# Patient Record
Sex: Female | Born: 1950 | Race: White | Hispanic: No | Marital: Married | Smoking: Former smoker
Health system: Southern US, Community
[De-identification: ages and names within clinical notes are randomized; demographics above are authoritative.]

## PROBLEM LIST (undated history)

## (undated) DIAGNOSIS — E039 Hypothyroidism, unspecified: Secondary | ICD-10-CM

## (undated) DIAGNOSIS — I10 Essential (primary) hypertension: Secondary | ICD-10-CM

## (undated) DIAGNOSIS — D539 Nutritional anemia, unspecified: Secondary | ICD-10-CM

## (undated) HISTORY — PX: BREAST BIOPSY: SHX20

## (undated) HISTORY — PX: THYROIDECTOMY: SHX17

## (undated) HISTORY — PX: TUBAL LIGATION: SHX77

## (undated) HISTORY — DX: Nutritional anemia, unspecified: D53.9

---

## 2004-06-08 ENCOUNTER — Ambulatory Visit: Payer: Self-pay | Admitting: Unknown Physician Specialty

## 2004-06-18 ENCOUNTER — Ambulatory Visit: Payer: Self-pay | Admitting: Otolaryngology

## 2004-07-26 ENCOUNTER — Ambulatory Visit: Payer: Self-pay | Admitting: Otolaryngology

## 2008-05-28 ENCOUNTER — Ambulatory Visit: Payer: Self-pay

## 2009-08-12 ENCOUNTER — Ambulatory Visit: Payer: Self-pay | Admitting: Family Medicine

## 2010-12-21 ENCOUNTER — Ambulatory Visit: Payer: Self-pay | Admitting: Family Medicine

## 2012-03-07 ENCOUNTER — Ambulatory Visit: Payer: Self-pay | Admitting: Family Medicine

## 2012-09-05 ENCOUNTER — Ambulatory Visit: Payer: Self-pay | Admitting: Family Medicine

## 2013-04-29 ENCOUNTER — Ambulatory Visit: Payer: Self-pay | Admitting: Family Medicine

## 2014-02-12 ENCOUNTER — Ambulatory Visit: Payer: Self-pay | Admitting: Nurse Practitioner

## 2014-05-01 ENCOUNTER — Ambulatory Visit
Admit: 2014-05-01 | Disposition: A | Discharge status: Home / Self Care | Payer: Self-pay | Attending: Nurse Practitioner | Admitting: Nurse Practitioner

## 2016-05-06 ENCOUNTER — Other Ambulatory Visit: Payer: Self-pay | Admitting: Nurse Practitioner

## 2016-05-06 DIAGNOSIS — Z13820 Encounter for screening for osteoporosis: Secondary | ICD-10-CM

## 2016-05-06 DIAGNOSIS — Z1231 Encounter for screening mammogram for malignant neoplasm of breast: Secondary | ICD-10-CM

## 2016-06-23 ENCOUNTER — Encounter: Payer: Self-pay | Admitting: Radiology

## 2016-06-23 ENCOUNTER — Ambulatory Visit
Admission: RE | Admit: 2016-06-23 | Discharge: 2016-06-23 | Disposition: A | Discharge status: Home / Self Care | Payer: Medicare HMO | Source: Ambulatory Visit | Attending: Nurse Practitioner | Admitting: Nurse Practitioner

## 2016-06-23 DIAGNOSIS — M85851 Other specified disorders of bone density and structure, right thigh: Secondary | ICD-10-CM | POA: Insufficient documentation

## 2016-06-23 DIAGNOSIS — Z13820 Encounter for screening for osteoporosis: Secondary | ICD-10-CM

## 2016-06-23 DIAGNOSIS — Z1231 Encounter for screening mammogram for malignant neoplasm of breast: Secondary | ICD-10-CM

## 2016-06-23 DIAGNOSIS — R928 Other abnormal and inconclusive findings on diagnostic imaging of breast: Secondary | ICD-10-CM | POA: Diagnosis not present

## 2016-06-23 DIAGNOSIS — Z780 Asymptomatic menopausal state: Secondary | ICD-10-CM | POA: Insufficient documentation

## 2016-06-23 DIAGNOSIS — E0590 Thyrotoxicosis, unspecified without thyrotoxic crisis or storm: Secondary | ICD-10-CM | POA: Diagnosis not present

## 2016-06-27 ENCOUNTER — Other Ambulatory Visit: Payer: Self-pay | Admitting: Nurse Practitioner

## 2016-06-27 DIAGNOSIS — R928 Other abnormal and inconclusive findings on diagnostic imaging of breast: Secondary | ICD-10-CM

## 2016-06-27 DIAGNOSIS — N6489 Other specified disorders of breast: Secondary | ICD-10-CM

## 2016-07-14 ENCOUNTER — Ambulatory Visit
Admission: RE | Admit: 2016-07-14 | Discharge: 2016-07-14 | Disposition: A | Discharge status: Home / Self Care | Payer: Medicare HMO | Source: Ambulatory Visit | Attending: Nurse Practitioner | Admitting: Nurse Practitioner

## 2016-07-14 DIAGNOSIS — R928 Other abnormal and inconclusive findings on diagnostic imaging of breast: Secondary | ICD-10-CM | POA: Diagnosis present

## 2016-07-14 DIAGNOSIS — N6489 Other specified disorders of breast: Secondary | ICD-10-CM

## 2018-02-05 ENCOUNTER — Other Ambulatory Visit: Payer: Self-pay | Admitting: Nurse Practitioner

## 2018-02-06 ENCOUNTER — Other Ambulatory Visit: Payer: Self-pay | Admitting: Nurse Practitioner

## 2018-02-06 DIAGNOSIS — Z1231 Encounter for screening mammogram for malignant neoplasm of breast: Secondary | ICD-10-CM

## 2018-02-16 ENCOUNTER — Other Ambulatory Visit: Payer: Self-pay | Admitting: Nurse Practitioner

## 2018-02-16 DIAGNOSIS — Z13820 Encounter for screening for osteoporosis: Secondary | ICD-10-CM

## 2018-06-06 ENCOUNTER — Encounter: Payer: Self-pay | Admitting: Family Medicine

## 2018-06-12 ENCOUNTER — Encounter: Payer: Self-pay | Admitting: *Deleted

## 2018-06-19 ENCOUNTER — Ambulatory Visit (INDEPENDENT_AMBULATORY_CARE_PROVIDER_SITE_OTHER): Payer: Medicare HMO | Admitting: Gastroenterology

## 2018-06-19 ENCOUNTER — Other Ambulatory Visit: Payer: Self-pay

## 2018-06-19 ENCOUNTER — Encounter: Payer: Self-pay | Admitting: Gastroenterology

## 2018-06-19 VITALS — BP 123/79 | HR 101 | Temp 98.9°F | Ht 63.0 in | Wt 168.4 lb

## 2018-06-19 DIAGNOSIS — D510 Vitamin B12 deficiency anemia due to intrinsic factor deficiency: Secondary | ICD-10-CM | POA: Diagnosis not present

## 2018-06-19 DIAGNOSIS — Z1211 Encounter for screening for malignant neoplasm of colon: Secondary | ICD-10-CM | POA: Diagnosis not present

## 2018-06-19 NOTE — Progress Notes (Signed)
April Middleton [address]  [place]  [place], [place] [address]  Main: [phone]  Fax: [phone]   Gastroenterology Consultation  Referring Provider:     Marguerita Merles, MD Primary Care Physician:  Marguerita Merles, MD Reason for Consultation:     Anemia        HPI:   Chief complaint: Anemia  April Middleton is a 68 y.o. y/o female referred for consultation & management  by Dr. Lennox Grumbles, Connye Burkitt, MD.  Patient recently found to have a hemoglobin of 7.2 by primary care provider, with macrocytic anemia and work-up showing elevated intrinsic factor antibody of 64.  Patient has been started on B12 replacement by primary care provider.  No prior history of colonoscopy.  No family history of colon cancer.  The patient denies abdominal or flank pain, anorexia, nausea or vomiting, dysphagia, change in bowel habits or black or bloody stools or weight loss.    Past medical history: Hyperlipidemia hypertension Past Surgical History:  Procedure Laterality Date  . BREAST BIOPSY Left     Prior to Admission medications   Not on File   Family history: Hypertension  Social History   Tobacco Use  . Smoking status: Not on file  Substance Use Topics  . Alcohol use: Not on file  . Drug use: Not on file    Allergies as of 06/19/2018  . (Not on File)    Review of Systems:    All systems reviewed and negative except where noted in HPI.   Physical Exam:   Vitals:   06/19/18 1627  BP: 123/79  Pulse: (!) 101  Temp: 98.9 F (37.2 C)  TempSrc: Oral  Weight: 168 lb 6.4 oz (76.4 kg)  Height: 5\' 3"  (1.6 m)   No LMP recorded. Patient is postmenopausal. Psych:  Alert and cooperative. Normal mood and affect. General:   Alert,  Well-developed, well-nourished, pleasant and cooperative in NAD Head:  Normocephalic and atraumatic. Eyes:  Sclera clear, no icterus.   Conjunctiva pink. Ears:  Normal auditory acuity. Nose:  No deformity, discharge, or lesions. Mouth:  No  deformity or lesions,oropharynx pink & moist. Neck:  Supple; no masses or thyromegaly. Abdomen:  Normal bowel sounds.  No bruits.  Soft, non-tender and non-distended without masses, hepatosplenomegaly or hernias noted.  No guarding or rebound tenderness.    Msk:  Symmetrical without gross deformities. Good, equal movement & strength bilaterally. Pulses:  Normal pulses noted. Extremities:  No clubbing or edema.  No cyanosis. Neurologic:  Alert and oriented x3;  grossly normal neurologically. Skin:  Intact without significant lesions or rashes. No jaundice. Lymph Nodes:  No significant cervical adenopathy. Psych:  Alert and cooperative. Normal mood and affect.   Labs: Scanned labs reviewed, hemoglobin 7.2, elevated MCV at 103, intrinsic factor 64  Imaging Studies: No results found.  Assessment and Plan:   April Middleton is a 68 y.o. y/o female has been referred for anemia  Patient has never had a colonoscopy, and given significant anemia, EGD and colonoscopy recommended to rule out underlying lesions and evaluate for any underlying malignancy  Patient is also never had a screening colonoscopy and colonoscopy can be both for screening, evaluating for polyps, and due to her anemia  We will refer to hematology due to elevated intrinsic factor antibodies Continue B12 replacement as per primary care provider until patient sees hematology  I have discussed alternative options, risks & benefits,  which include, but are not limited to,  bleeding, infection, perforation,respiratory complication & drug reaction.  The patient agrees with this plan & written consent will be obtained.     Dr April Middleton  Speech recognition software was used to dictate the above note.

## 2018-06-25 ENCOUNTER — Other Ambulatory Visit
Admission: RE | Admit: 2018-06-25 | Discharge: 2018-06-25 | Disposition: A | Discharge status: Home / Self Care | Payer: Medicare HMO | Source: Ambulatory Visit | Attending: Gastroenterology | Admitting: Gastroenterology

## 2018-06-25 ENCOUNTER — Other Ambulatory Visit: Payer: Self-pay

## 2018-06-25 DIAGNOSIS — Z1159 Encounter for screening for other viral diseases: Secondary | ICD-10-CM | POA: Diagnosis present

## 2018-06-26 LAB — NOVEL CORONAVIRUS, NAA (HOSP ORDER, SEND-OUT TO REF LAB; TAT 18-24 HRS): SARS-CoV-2, NAA: NOT DETECTED

## 2018-06-27 ENCOUNTER — Encounter: Payer: Self-pay | Admitting: *Deleted

## 2018-06-28 ENCOUNTER — Ambulatory Visit: Payer: Medicare HMO | Admitting: Certified Registered Nurse Anesthetist

## 2018-06-28 ENCOUNTER — Encounter: Payer: Self-pay | Admitting: *Deleted

## 2018-06-28 ENCOUNTER — Encounter
Admission: RE | Disposition: A | Discharge status: Home / Self Care | Payer: Self-pay | Source: Home / Self Care | Attending: Gastroenterology

## 2018-06-28 ENCOUNTER — Ambulatory Visit
Admission: RE | Admit: 2018-06-28 | Discharge: 2018-06-28 | Disposition: A | Discharge status: Home / Self Care | Payer: Medicare HMO | Attending: Gastroenterology | Admitting: Gastroenterology

## 2018-06-28 ENCOUNTER — Other Ambulatory Visit: Payer: Self-pay

## 2018-06-28 DIAGNOSIS — D127 Benign neoplasm of rectosigmoid junction: Secondary | ICD-10-CM | POA: Diagnosis not present

## 2018-06-28 DIAGNOSIS — Z87891 Personal history of nicotine dependence: Secondary | ICD-10-CM | POA: Diagnosis not present

## 2018-06-28 DIAGNOSIS — K2940 Chronic atrophic gastritis without bleeding: Secondary | ICD-10-CM | POA: Insufficient documentation

## 2018-06-28 DIAGNOSIS — I10 Essential (primary) hypertension: Secondary | ICD-10-CM | POA: Diagnosis not present

## 2018-06-28 DIAGNOSIS — D122 Benign neoplasm of ascending colon: Secondary | ICD-10-CM | POA: Insufficient documentation

## 2018-06-28 DIAGNOSIS — Z79890 Hormone replacement therapy (postmenopausal): Secondary | ICD-10-CM | POA: Insufficient documentation

## 2018-06-28 DIAGNOSIS — E039 Hypothyroidism, unspecified: Secondary | ICD-10-CM | POA: Diagnosis not present

## 2018-06-28 DIAGNOSIS — K5730 Diverticulosis of large intestine without perforation or abscess without bleeding: Secondary | ICD-10-CM | POA: Diagnosis not present

## 2018-06-28 DIAGNOSIS — K2980 Duodenitis without bleeding: Secondary | ICD-10-CM | POA: Diagnosis not present

## 2018-06-28 DIAGNOSIS — K621 Rectal polyp: Secondary | ICD-10-CM | POA: Diagnosis not present

## 2018-06-28 DIAGNOSIS — K317 Polyp of stomach and duodenum: Secondary | ICD-10-CM

## 2018-06-28 DIAGNOSIS — K6389 Other specified diseases of intestine: Secondary | ICD-10-CM

## 2018-06-28 DIAGNOSIS — Z79899 Other long term (current) drug therapy: Secondary | ICD-10-CM | POA: Insufficient documentation

## 2018-06-28 DIAGNOSIS — K3189 Other diseases of stomach and duodenum: Secondary | ICD-10-CM | POA: Diagnosis not present

## 2018-06-28 DIAGNOSIS — K635 Polyp of colon: Secondary | ICD-10-CM

## 2018-06-28 DIAGNOSIS — Z1211 Encounter for screening for malignant neoplasm of colon: Secondary | ICD-10-CM

## 2018-06-28 DIAGNOSIS — D510 Vitamin B12 deficiency anemia due to intrinsic factor deficiency: Secondary | ICD-10-CM

## 2018-06-28 HISTORY — DX: Hypothyroidism, unspecified: E03.9

## 2018-06-28 HISTORY — PX: ESOPHAGOGASTRODUODENOSCOPY (EGD) WITH PROPOFOL: SHX5813

## 2018-06-28 HISTORY — DX: Essential (primary) hypertension: I10

## 2018-06-28 HISTORY — PX: COLONOSCOPY WITH PROPOFOL: SHX5780

## 2018-06-28 LAB — KOH PREP: KOH Prep: NONE SEEN

## 2018-06-28 SURGERY — COLONOSCOPY WITH PROPOFOL
Anesthesia: General

## 2018-06-28 MED ORDER — SODIUM CHLORIDE 0.9 % IV SOLN
INTRAVENOUS | Status: DC
  Administered 2018-06-28: 11:00:00 via INTRAVENOUS

## 2018-06-28 MED ORDER — PROPOFOL 500 MG/50ML IV EMUL
INTRAVENOUS | Status: AC
  Filled 2018-06-28: qty 50

## 2018-06-28 MED ORDER — PROPOFOL 500 MG/50ML IV EMUL
INTRAVENOUS | Status: DC | PRN
  Administered 2018-06-28: 120 ug/kg/min via INTRAVENOUS

## 2018-06-28 MED ORDER — GLYCOPYRROLATE 0.2 MG/ML IJ SOLN
INTRAMUSCULAR | Status: DC | PRN
  Administered 2018-06-28: 0.2 mg via INTRAVENOUS

## 2018-06-28 MED ORDER — MIDAZOLAM HCL 5 MG/5ML IJ SOLN
INTRAMUSCULAR | Status: DC | PRN
  Administered 2018-06-28: 2 mg via INTRAVENOUS

## 2018-06-28 MED ORDER — LIDOCAINE HCL (PF) 2 % IJ SOLN
INTRAMUSCULAR | Status: AC
  Filled 2018-06-28: qty 10

## 2018-06-28 MED ORDER — MIDAZOLAM HCL 2 MG/2ML IJ SOLN
INTRAMUSCULAR | Status: AC
  Filled 2018-06-28: qty 2

## 2018-06-28 MED ORDER — PROPOFOL 10 MG/ML IV BOLUS
INTRAVENOUS | Status: DC | PRN
  Administered 2018-06-28: 30 mg via INTRAVENOUS
  Administered 2018-06-28: 70 mg via INTRAVENOUS

## 2018-06-28 MED ORDER — GLYCOPYRROLATE 0.2 MG/ML IJ SOLN
INTRAMUSCULAR | Status: AC
  Filled 2018-06-28: qty 1

## 2018-06-28 NOTE — Anesthesia Preprocedure Evaluation (Signed)
Anesthesia Evaluation  Patient identified by MRN, date of birth, ID band Patient awake    Reviewed: Allergy & Precautions, NPO status , Patient's Chart, lab work & pertinent test results, reviewed documented beta blocker date and time   History of Anesthesia Complications Negative for: history of anesthetic complications  Airway Mallampati: III       Dental   Pulmonary neg sleep apnea, neg COPD, former smoker,           Cardiovascular hypertension, Pt. on medications and Pt. on home beta blockers (-) Past MI and (-) CHF (-) dysrhythmias (-) Valvular Problems/Murmurs     Neuro/Psych neg Seizures    GI/Hepatic Neg liver ROS, neg GERD  ,  Endo/Other  neg diabetes  Renal/GU negative Renal ROS     Musculoskeletal   Abdominal   Peds  Hematology   Anesthesia Other Findings   Reproductive/Obstetrics                            Anesthesia Physical Anesthesia Plan  ASA: II  Anesthesia Plan: General   Post-op Pain Management:    Induction: Intravenous  PONV Risk Score and Plan: 3 and Propofol infusion, TIVA and Treatment may vary due to age or medical condition  Airway Management Planned: Nasal Cannula  Additional Equipment:   Intra-op Plan:   Post-operative Plan:   Informed Consent: I have reviewed the patients History and Physical, chart, labs and discussed the procedure including the risks, benefits and alternatives for the proposed anesthesia with the patient or authorized representative who has indicated his/her understanding and acceptance.       Plan Discussed with:   Anesthesia Plan Comments:         Anesthesia Quick Evaluation

## 2018-06-28 NOTE — Anesthesia Post-op Follow-up Note (Signed)
Anesthesia QCDR form completed.        

## 2018-06-28 NOTE — Transfer of Care (Signed)
Immediate Anesthesia Transfer of Care Note  Patient: April Middleton  Procedure(s) Performed: COLONOSCOPY WITH PROPOFOL (N/A ) ESOPHAGOGASTRODUODENOSCOPY (EGD) WITH PROPOFOL (N/A )  Patient Location: Endoscopy Unit  Anesthesia Type:General  Level of Consciousness: awake, alert  and patient cooperative  Airway & Oxygen Therapy: Patient Spontanous Breathing and Patient connected to nasal cannula oxygen  Post-op Assessment: Report given to RN and Post -op Vital signs reviewed and stable  Post vital signs: Reviewed and stable  Last Vitals:  Vitals Value Taken Time  BP 122/68 06/28/18 1255  Temp 36.4 C 06/28/18 1255  Pulse 88 06/28/18 1257  Resp 18 06/28/18 1257  SpO2 100 % 06/28/18 1257  Vitals shown include unvalidated device data.  Last Pain:  Vitals:   06/28/18 1255  TempSrc: Tympanic  PainSc:          Complications: No apparent anesthesia complications

## 2018-06-28 NOTE — Anesthesia Postprocedure Evaluation (Signed)
Anesthesia Post Note  Patient: April Middleton  Procedure(s) Performed: COLONOSCOPY WITH PROPOFOL (N/A ) ESOPHAGOGASTRODUODENOSCOPY (EGD) WITH PROPOFOL (N/A )  Patient location during evaluation: Endoscopy Anesthesia Type: General Level of consciousness: awake and alert Pain management: pain level controlled Vital Signs Assessment: post-procedure vital signs reviewed and stable Respiratory status: spontaneous breathing, nonlabored ventilation, respiratory function stable and patient connected to nasal cannula oxygen Cardiovascular status: blood pressure returned to baseline and stable Postop Assessment: no apparent nausea or vomiting Anesthetic complications: no     Last Vitals:  Vitals:   06/28/18 1103 06/28/18 1255  BP: (!) 150/92 122/68  Pulse: (!) 113   Resp: 18   Temp: 37 C 36.4 C  SpO2: 96%     Last Pain:  Vitals:   06/28/18 1315  TempSrc:   PainSc: 0-No pain                 Martha Clan

## 2018-06-28 NOTE — H&P (Signed)
[name], MD [address], [place], [place], [place], [address] [address], [place], [place], [place], [address] Phone: [phone]  Fax: [phone]  Primary Care Physician:  Marguerita Merles, MD   Pre-Procedure History & Physical: HPI:  April Middleton is a 68 y.o. female is here for a colonoscopy and EGD.   Past Medical History:  Diagnosis Date  . Hypertension   . Hypothyroidism     Past Surgical History:  Procedure Laterality Date  . BREAST BIOPSY Left   . THYROIDECTOMY    . TUBAL LIGATION      Prior to Admission medications   Medication Sig Start Date End Date Taking? Authorizing Provider  butalbital-acetaminophen-caffeine (FIORICET) 50-325-40 MG tablet TAKE 1 TO 2 TABLETS BY MOUTH EVERY 4 HOURS MAX 6 TABS DAILY 02/03/18  Yes [provider]  fluticasone (FLONASE) 50 MCG/ACT nasal spray  06/19/18  Yes [provider]  gabapentin (NEURONTIN) 100 MG capsule Take 100 mg by mouth 3 (three) times daily.   Yes [provider]  hydrochlorothiazide (HYDRODIURIL) 25 MG tablet Take 25 mg by mouth daily. for high blood pressure 04/01/18  Yes [provider]  levothyroxine (SYNTHROID) 100 MCG tablet 1 BY MOUTH ONCE A DAY FOR HYPOTHYROIDISM 06/05/18  Yes [provider]  metoprolol succinate (TOPROL-XL) 25 MG 24 hr tablet Take 25 mg by mouth daily. for high blood pressure 04/04/18  Yes [provider]  amitriptyline (ELAVIL) 25 MG tablet 1 BY MOUTH NIGHTLY AT BEDTIME MAY INCREASE BY 25 MG IN ONE WEEK 04/25/18   [provider]  atorvastatin (LIPITOR) 20 MG tablet  06/19/18   [provider]  cyclobenzaprine (FLEXERIL) 5 MG tablet 1 TABLET BY MOUTH TWICE DAILY AS NEEDED FOR PAIN SEPERATE BY 8 HOURS MONITOR FOR SEDATION 06/05/18   [provider]  sulfamethoxazole-trimethoprim (BACTRIM DS) 800-160 MG tablet TAKE ONE TABLET BY MOUTH TWICE DAILY FOR 7 DAYS 02/06/18   [provider]    Allergies  as of 06/20/2018  . (No Known Allergies)    History reviewed. No pertinent family history.  Social History   Socioeconomic History  . Marital status: Married    Spouse name: Not on file  . Number of children: Not on file  . Years of education: Not on file  . Highest education level: Not on file  Occupational History  . Not on file  Social Needs  . Financial resource strain: Not on file  . Food insecurity    Worry: Not on file    Inability: Not on file  . Transportation needs    Medical: Not on file    Non-medical: Not on file  Tobacco Use  . Smoking status: Former Research scientist (life sciences)  . Smokeless tobacco: Never Used  Substance and Sexual Activity  . Alcohol use: Yes    Comment: occ  . Drug use: Never  . Sexual activity: Not on file  Lifestyle  . Physical activity    Days per week: Not on file    Minutes per session: Not on file  . Stress: Not on file  Relationships  . Social Herbalist on phone: Not on file    Gets together: Not on file    Attends religious service: Not on file    Active member of club or organization: Not on file    Attends meetings of clubs or organizations: Not on file    Relationship status: Not on file  . Intimate partner violence  Fear of current or ex partner: Not on file    Emotionally abused: Not on file    Physically abused: Not on file    Forced sexual activity: Not on file  Other Topics Concern  . Not on file  Social History Narrative  . Not on file    Review of Systems: See HPI, otherwise negative ROS  Physical Exam: BP (!) 150/92   Pulse (!) 113   Temp 98.6 F (37 C) (Tympanic)   Resp 18   Ht 5\' 3"  (1.6 m)   Wt 74.8 kg   SpO2 96%   BMI 29.23 kg/m  General:   Alert,  pleasant and cooperative in NAD Head:  Normocephalic and atraumatic. Neck:  Supple; no masses or thyromegaly. Lungs:  Clear throughout to auscultation, normal respiratory effort.    Heart:  +S1, +S2, Regular rate and rhythm, No edema. Abdomen:  Soft,  nontender and nondistended. Normal bowel sounds, without guarding, and without rebound.   Neurologic:  Alert and  oriented x4;  grossly normal neurologically.  Impression/Plan: April Middleton is here for a colonoscopy to be performed for average risk screening and EGD for anemia  Risks, benefits, limitations, and alternatives regarding the procedures have been reviewed with the patient.  Questions have been answered.  All parties agreeable.   Virgel Manifold, MD  06/28/2018, 11:48 AM

## 2018-06-28 NOTE — Op Note (Signed)
[facility] Gastroenterology Patient Name: April Middleton Procedure Date: 06/28/2018 11:47 AM MRN: [id number] Account #: [id number] Date of Birth: 01-Jan-1951 Admit Type: Outpatient Age: 68 Room: [facility] ENDO ROOM 3 Gender: Female Note Status: Finalized Procedure:            Colonoscopy Indications:          Screening for colorectal malignant neoplasm Providers:            Jermia Rigsby B. Bonna Gains MD, MD Medicines:            Monitored Anesthesia Care Complications:        No immediate complications. Procedure:            Pre-Anesthesia Assessment:                       - ASA Grade Assessment: II - A patient with mild                        systemic disease.                       - Prior to the procedure, a History and Physical was                        performed, and patient medications, allergies and                        sensitivities were reviewed. The patient's tolerance of                        previous anesthesia was reviewed.                       - The risks and benefits of the procedure and the                        sedation options and risks were discussed with the                        patient. All questions were answered and informed                        consent was obtained.                       - Patient identification and proposed procedure were                        verified prior to the procedure by the physician, the                        nurse, the anesthesiologist, the anesthetist and the                        technician. The procedure was verified in the procedure                        room.                       After obtaining informed consent, the colonoscope was  passed under direct vision. Throughout the procedure,                        the patient's blood pressure, pulse, and oxygen                        saturations were monitored continuously. The                        Colonoscope was introduced through  the anus and                        advanced to the the cecum, identified by appendiceal                        orifice and ileocecal valve. The colonoscopy was                        performed with ease. The patient tolerated the                        procedure well. The quality of the bowel preparation                        was good. Findings:      The perianal and digital rectal examinations were normal.      A patchy area of mild melanosis was found in the ascending colon and in       the cecum. Biopsies were taken with a cold forceps for histology.      A 6 mm polyp was found in the ascending colon. The polyp was sessile.       Polypectomy was attempted, initially using a cold snare. Polyp resection       was incomplete with this device. This intervention then required a       different device and polypectomy technique. The polyp was removed with a       cold biopsy forceps. Resection and retrieval were complete.      A 5 mm polyp was found in the rectum. The polyp was sessile. The polyp       was removed with a cold snare. Resection and retrieval were complete.      Multiple diverticula were found in the sigmoid colon.      The exam was otherwise without abnormality.      The rectum, sigmoid colon, descending colon, transverse colon, ascending       colon and cecum appeared normal.      The retroflexed view of the distal rectum and anal verge was normal and       showed no anal or rectal abnormalities. Impression:           - Melanosis in the colon. Biopsied.                       - One 6 mm polyp in the ascending colon, removed with a                        cold biopsy forceps. Resected and retrieved.                       - One 5 mm polyp in the  rectum, removed with a cold                        snare. Resected and retrieved.                       - Diverticulosis in the sigmoid colon.                       - The examination was otherwise normal.                       - The  rectum, sigmoid colon, descending colon,                        transverse colon, ascending colon and cecum are normal.                       - The distal rectum and anal verge are normal on                        retroflexion view. Recommendation:       - Discharge patient to home (with escort).                       - Advance diet as tolerated.                       - Continue present medications.                       - Await pathology results.                       - Repeat colonoscopy in 5 years.                       - The findings and recommendations were discussed with                        the patient.                       - The findings and recommendations were discussed with                        the patient's family.                       - Return to primary care physician as previously                        scheduled.                       - High fiber diet. Procedure Code(s):    --- Professional ---                       [phone], Colonoscopy, flexible; with removal of tumor(s),                        polyp(s), or other lesion(s) by snare technique  59563, 82, Colonoscopy, flexible; with biopsy, single                        or multiple Diagnosis Code(s):    --- Professional ---                       Z12.11, Encounter for screening for malignant neoplasm                        of colon                       K63.89, Other specified diseases of intestine                       K63.5, Polyp of colon                       K62.1, Rectal polyp                       K57.30, Diverticulosis of large intestine without                        perforation or abscess without bleeding CPT copyright 2019 American Medical Association. All rights reserved. The codes documented in this report are preliminary and upon coder review may  be revised to meet current compliance requirements.  [name], MD Margretta Sidle B. Bonna Gains MD, MD 06/28/2018 12:57:16 PM This  report has been signed electronically. Number of Addenda: 0 Note Initiated On: 06/28/2018 11:47 AM Scope Withdrawal Time: 0 hours 16 minutes 15 seconds  Total Procedure Duration: 0 hours 26 minutes 14 seconds  Estimated Blood Loss: Estimated blood loss: none.      [facility]

## 2018-06-28 NOTE — Op Note (Signed)
[facility] Gastroenterology Patient Name: April Middleton Procedure Date: 06/28/2018 11:48 AM MRN: [id number] Account #: [id number] Date of Birth: 09-Jan-1951 Admit Type: Outpatient Age: 68 Room: [facility] ENDO ROOM 3 Gender: Female Note Status: Finalized Procedure:            Upper GI endoscopy Indications:          Pernicious anemia Providers:            Varnita B. Bonna Gains MD, MD Referring MD:         Marguerita Merles, MD (Referring MD) Medicines:            Monitored Anesthesia Care Complications:        No immediate complications. Procedure:            Pre-Anesthesia Assessment:                       - Prior to the procedure, a History and Physical was                        performed, and patient medications, allergies and                        sensitivities were reviewed. The patient's tolerance of                        previous anesthesia was reviewed.                       - The risks and benefits of the procedure and the                        sedation options and risks were discussed with the                        patient. All questions were answered and informed                        consent was obtained.                       - Patient identification and proposed procedure were                        verified prior to the procedure by the physician, the                        nurse, the anesthesiologist, the anesthetist and the                        technician. The procedure was verified in the procedure                        room.                       - ASA Grade Assessment: II - A patient with mild                        systemic disease.  After obtaining informed consent, the endoscope was                        passed under direct vision. Throughout the procedure,                        the patient's blood pressure, pulse, and oxygen                        saturations were monitored continuously. The Endoscope           was introduced through the mouth, and advanced to the                        second part of duodenum. The upper GI endoscopy was                        accomplished with ease. The patient tolerated the                        procedure well. Findings:      White nummular lesions were noted in the entire esophagus. Brushings for       KOH prep were obtained in the mid esophagus.      The exam of the esophagus was otherwise normal.      Patchy mildly erythematous mucosa without bleeding was found in the       gastric antrum. Biopsies were taken with a cold forceps for histology.       Biopsies were obtained in the gastric body, at the incisura and in the       gastric antrum with cold forceps for histology.      A single 5 mm mucosal papule (nodule) with no bleeding and no stigmata       of recent bleeding was found in the gastric antrum. Biopsies were taken       with a cold forceps for histology.      Two 4 mm sessile polyps with no bleeding and no stigmata of recent       bleeding were found in the gastric body. Biopsies were taken with a cold       forceps for histology.      The duodenal bulb, second portion of the duodenum and examined duodenum       were normal. Biopsies for histology were taken with a cold forceps for       evaluation of celiac disease. Impression:           - White nummular lesions in esophageal mucosa.                        Brushings performed.                       - Erythematous mucosa in the antrum. Biopsied.                       - A single mucosal papule (nodule) found in the                        stomach. Biopsied.                       - Two  gastric polyps. Biopsied.                       - Normal duodenal bulb, second portion of the duodenum                        and examined duodenum. Biopsied.                       - Biopsies were obtained in the gastric body, at the                        incisura and in the gastric antrum. Recommendation:        - Await pathology results.                       - Discharge patient to home (with escort).                       - Advance diet as tolerated.                       - Continue present medications.                       - Patient has a contact number available for                        emergencies. The signs and symptoms of potential                        delayed complications were discussed with the patient.                        Return to normal activities tomorrow. Written discharge                        instructions were provided to the patient.                       - Discharge patient to home (with escort).                       - The findings and recommendations were discussed with                        the patient.                       - The findings and recommendations were discussed with                        the patient's family. 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 All rights reserved. The codes documented in this report are preliminary and upon coder review may  be revised to meet current compliance requirements.  [name], MD Margretta Sidle B. Bonna Gains MD, MD 06/28/2018 12:19:25 PM This report has been signed electronically. Number of Addenda: 0 Note Initiated On: 06/28/2018 11:48 AM Estimated Blood Loss: Estimated blood loss: none.      [facility]

## 2018-06-29 ENCOUNTER — Encounter: Payer: Self-pay | Admitting: Gastroenterology

## 2018-06-29 LAB — SURGICAL PATHOLOGY

## 2018-07-02 ENCOUNTER — Telehealth: Payer: Self-pay

## 2018-07-02 NOTE — Telephone Encounter (Signed)
Noted on referral Dr. Elroy Channel office spoke with and confirmed her appt. For 07/16/2018 at 1:30pm. I left message regarding this, to remind her.

## 2018-07-02 NOTE — Telephone Encounter (Signed)
-----   Message from Virgel Manifold, MD sent at 06/28/2018  1:06 PM EDT ----- Can you check in on her hematology referral, she says she has not heard

## 2018-07-16 ENCOUNTER — Inpatient Hospital Stay: Payer: Medicare HMO | Attending: Oncology | Admitting: Oncology

## 2018-07-16 ENCOUNTER — Inpatient Hospital Stay: Payer: Medicare HMO

## 2018-07-16 ENCOUNTER — Encounter: Payer: Self-pay | Admitting: Oncology

## 2018-07-16 ENCOUNTER — Other Ambulatory Visit: Payer: Self-pay

## 2018-07-16 VITALS — BP 143/84 | HR 111 | Temp 96.0°F | Wt 169.6 lb

## 2018-07-16 DIAGNOSIS — Z79899 Other long term (current) drug therapy: Secondary | ICD-10-CM | POA: Diagnosis not present

## 2018-07-16 DIAGNOSIS — Z87891 Personal history of nicotine dependence: Secondary | ICD-10-CM | POA: Diagnosis not present

## 2018-07-16 DIAGNOSIS — I10 Essential (primary) hypertension: Secondary | ICD-10-CM

## 2018-07-16 DIAGNOSIS — D510 Vitamin B12 deficiency anemia due to intrinsic factor deficiency: Secondary | ICD-10-CM

## 2018-07-16 DIAGNOSIS — D539 Nutritional anemia, unspecified: Secondary | ICD-10-CM

## 2018-07-16 DIAGNOSIS — E039 Hypothyroidism, unspecified: Secondary | ICD-10-CM | POA: Diagnosis not present

## 2018-07-16 DIAGNOSIS — Z7982 Long term (current) use of aspirin: Secondary | ICD-10-CM | POA: Diagnosis not present

## 2018-07-16 DIAGNOSIS — E785 Hyperlipidemia, unspecified: Secondary | ICD-10-CM | POA: Diagnosis not present

## 2018-07-16 LAB — COMPREHENSIVE METABOLIC PANEL
ALT: 21 U/L (ref 0–44)
AST: 19 U/L (ref 15–41)
Albumin: 4.5 g/dL (ref 3.5–5.0)
Alkaline Phosphatase: 98 U/L (ref 38–126)
Anion gap: 14 (ref 5–15)
BUN: 18 mg/dL (ref 8–23)
CO2: 26 mmol/L (ref 22–32)
Calcium: 9.2 mg/dL (ref 8.9–10.3)
Chloride: 96 mmol/L — ABNORMAL LOW (ref 98–111)
Creatinine, Ser: 0.66 mg/dL (ref 0.44–1.00)
GFR calc Af Amer: >60 mL/min (ref >60)
GFR calc non Af Amer: >60 mL/min (ref >60)
Glucose, Bld: 189 mg/dL — ABNORMAL HIGH (ref 70–99)
Potassium: 3.3 mmol/L — ABNORMAL LOW (ref 3.5–5.1)
Sodium: 136 mmol/L (ref 135–145)
Total Bilirubin: 0.6 mg/dL (ref 0.3–1.2)
Total Protein: 8 g/dL (ref 6.5–8.1)

## 2018-07-16 LAB — CBC WITH DIFFERENTIAL/PLATELET
Abs Immature Granulocytes: 0.04 10*3/uL (ref 0.00–0.07)
Basophils Absolute: 0.1 10*3/uL (ref 0.0–0.1)
Basophils Relative: 1 %
Eosinophils Absolute: 0.8 10*3/uL — ABNORMAL HIGH (ref 0.0–0.5)
Eosinophils Relative: 6 %
HCT: 37.6 % (ref 36.0–46.0)
Hemoglobin: 12.4 g/dL (ref 12.0–15.0)
Immature Granulocytes: 0 %
Lymphocytes Relative: 25 %
Lymphs Abs: 3.3 10*3/uL (ref 0.7–4.0)
MCH: 31.3 pg (ref 26.0–34.0)
MCHC: 33 g/dL (ref 30.0–36.0)
MCV: 94.9 fL (ref 80.0–100.0)
Monocytes Absolute: 0.9 10*3/uL (ref 0.1–1.0)
Monocytes Relative: 7 %
Neutro Abs: 8 10*3/uL — ABNORMAL HIGH (ref 1.7–7.7)
Neutrophils Relative %: 61 %
Platelets: 517 10*3/uL — ABNORMAL HIGH (ref 150–400)
RBC: 3.96 MIL/uL (ref 3.87–5.11)
RDW: 15.8 % — ABNORMAL HIGH (ref 11.5–15.5)
WBC: 13 10*3/uL — ABNORMAL HIGH (ref 4.0–10.5)
nRBC: 0 % (ref 0.0–0.2)

## 2018-07-16 LAB — RETICULOCYTES
Immature Retic Fract: 5 % (ref 2.3–15.9)
RBC.: 3.96 MIL/uL (ref 3.87–5.11)
Retic Count, Absolute: 64.2 10*3/uL (ref 19.0–186.0)
Retic Ct Pct: 1.6 % (ref 0.4–3.1)

## 2018-07-16 LAB — T4, FREE: Free T4: 1.12 ng/dL (ref 0.61–1.12)

## 2018-07-16 LAB — IRON AND TIBC
Iron: 59 ug/dL (ref 28–170)
Saturation Ratios: 13 % (ref 10.4–31.8)
TIBC: 445 ug/dL (ref 250–450)
UIBC: 386 ug/dL

## 2018-07-16 LAB — VITAMIN B12: Vitamin B-12: >7500 pg/mL — ABNORMAL HIGH (ref 180–914)

## 2018-07-16 LAB — FERRITIN: Ferritin: 27 ng/mL (ref 11–307)

## 2018-07-16 LAB — TSH: TSH: 3.273 u[IU]/mL (ref 0.350–4.500)

## 2018-07-16 LAB — FOLATE: Folate: 20 ng/mL (ref >5.9)

## 2018-07-16 LAB — LACTATE DEHYDROGENASE: LDH: 139 U/L (ref 98–192)

## 2018-07-16 MED ORDER — CYANOCOBALAMIN 1000 MCG/ML IJ SOLN
1000.0000 ug | INTRAMUSCULAR | Status: DC
  Administered 2018-07-16: 14:00:00 1000 ug via INTRAMUSCULAR
  Filled 2018-07-16: qty 1

## 2018-07-16 NOTE — Progress Notes (Signed)
Hematology/Oncology Consult note [facility] Telephone:([phone] [phone]  Patient Care Team: Marguerita Merles, MD as PCP - General (Family Medicine)   Name of the patient: April Middleton  [id number]  [birth year]    Reason for referral-macrocytic anemia   Referring physician-Dr. Bonna Gains  Date of visit: 07/16/18   History of presenting illness- Patient is a 68 year old female with a past medical history significant for hypothyroidism and hyperlipidemia who has been referred to [place] for possible B12 deficiency.  She was seen by Dr. Bonna Gains from GI and underwent EGD and colonoscopy for elevated intrinsic factor antibodies and macrocytic anemia.  EGD and colonoscopy did not reveal any bleeding.  She did have a couple of polyps in her colon as well as stomach which were taken out and were negative for malignancy.  I do not have any recent labs in our system to review.  Labs from [facility] from Jun 01, 2018 showed a normal white count, H&H of 7.2/20.5 with an MCV of 103 and a platelet count of 297.  Intrinsic factor antibody was elevated at 64.  TSH was elevated at 13 with a normal free T4 at 1.24.  Her levothyroxine dose was increased from 75 mcg to 88 mcg.  She was also given for weekly B12 shots but I do not have any B12 value as such that I can see in the system.  Patient states that her energy levels were somewhat improved after she received the 4 weekly shots but she feels fatigue again at this time.  Denies any bleeding in her stool or urine.  Denies any vaginal bleeding.  ECOG PS- 1  Pain scale- 0   Review of systems- Review of Systems  Constitutional: Positive for malaise/fatigue. Negative for chills, fever and weight loss.  HENT: Negative for congestion, ear discharge and nosebleeds.   Eyes: Negative for blurred vision.  Respiratory: Negative for cough, hemoptysis, sputum production, shortness of breath and wheezing.    Cardiovascular: Negative for chest pain, palpitations, orthopnea and claudication.  Gastrointestinal: Negative for abdominal pain, blood in stool, constipation, diarrhea, heartburn, melena, nausea and vomiting.  Genitourinary: Negative for dysuria, flank pain, frequency, hematuria and urgency.  Musculoskeletal: Negative for back pain, joint pain and myalgias.  Skin: Negative for rash.  Neurological: Negative for dizziness, tingling, focal weakness, seizures, weakness and headaches.  Endo/Heme/Allergies: Does not bruise/bleed easily.  Psychiatric/Behavioral: Negative for depression and suicidal ideas. The patient does not have insomnia.     No Known Allergies  Patient Active Problem List   Diagnosis Date Noted  . Macrocytic anemia 07/16/2018  . Special screening for malignant neoplasms, colon   . Polyp of ascending colon   . Rectal polyp   . Diverticulosis of large intestine without diverticulitis   . Melanosis, colon   . Vitamin B12 deficiency anemia due to intrinsic factor deficiency   . Stomach irritation   . Gastric polyp      Past Medical History:  Diagnosis Date  . Hypertension   . Hypothyroidism      Past Surgical History:  Procedure Laterality Date  . BREAST BIOPSY Left   . COLONOSCOPY WITH PROPOFOL N/A 06/28/2018   Procedure: COLONOSCOPY WITH PROPOFOL;  Surgeon: Virgel Manifold, MD;  Location: [facility] ENDOSCOPY;  Service: Endoscopy;  Laterality: N/A;  . ESOPHAGOGASTRODUODENOSCOPY (EGD) WITH PROPOFOL N/A 06/28/2018   Procedure: ESOPHAGOGASTRODUODENOSCOPY (EGD) WITH PROPOFOL;  Surgeon: Virgel Manifold, MD;  Location: [facility] ENDOSCOPY;  Service: Endoscopy;  Laterality: N/A;  .  THYROIDECTOMY    . TUBAL LIGATION      Social History   Socioeconomic History  . Marital status: Married    Spouse name: Not on file  . Number of children: Not on file  . Years of education: Not on file  . Highest education level: Not on file  Occupational History  . Not on file   Social Needs  . Financial resource strain: Not on file  . Food insecurity    Worry: Not on file    Inability: Not on file  . Transportation needs    Medical: Not on file    Non-medical: Not on file  Tobacco Use  . Smoking status: Former Smoker    Packs/day: 1.00    Years: 20.00    Pack years: 20.00    Types: Cigarettes    Quit date: 07/15/2013    Years since quitting: 5.0  . Smokeless tobacco: Never Used  Substance and Sexual Activity  . Alcohol use: Yes    Comment: occ  . Drug use: Never  . Sexual activity: Not on file  Lifestyle  . Physical activity    Days per week: Not on file    Minutes per session: Not on file  . Stress: Not on file  Relationships  . Social Herbalist on phone: Not on file    Gets together: Not on file    Attends religious service: Not on file    Active member of club or organization: Not on file    Attends meetings of clubs or organizations: Not on file    Relationship status: Not on file  . Intimate partner violence    Fear of current or ex partner: Not on file    Emotionally abused: Not on file    Physically abused: Not on file    Forced sexual activity: Not on file  Other Topics Concern  . Not on file  Social History Narrative  . Not on file     Family History  Problem Relation Age of Onset  . Cerebrovascular Accident Mother   . Cerebrovascular Accident Father   . Diabetes Sister      Current Outpatient Medications:  .  amitriptyline (ELAVIL) 25 MG tablet, 1 BY MOUTH NIGHTLY AT BEDTIME MAY INCREASE BY 25 MG IN ONE WEEK, Disp: , Rfl:  .  atorvastatin (LIPITOR) 20 MG tablet, , Disp: , Rfl:  .  butalbital-acetaminophen-caffeine (FIORICET) 50-325-40 MG tablet, TAKE 1 TO 2 TABLETS BY MOUTH EVERY 4 HOURS MAX 6 TABS DAILY, Disp: , Rfl:  .  cyclobenzaprine (FLEXERIL) 5 MG tablet, 1 TABLET BY MOUTH TWICE DAILY AS NEEDED FOR PAIN SEPERATE BY 8 HOURS MONITOR FOR SEDATION, Disp: , Rfl:  .  fluticasone (FLONASE) 50 MCG/ACT nasal spray,  , Disp: , Rfl:  .  gabapentin (NEURONTIN) 100 MG capsule, Take 100 mg by mouth 3 (three) times daily., Disp: , Rfl:  .  hydrochlorothiazide (HYDRODIURIL) 25 MG tablet, Take 25 mg by mouth daily. for high blood pressure, Disp: , Rfl:  .  levothyroxine (SYNTHROID) 88 MCG tablet, TAKE 1 TABLET BY MOUTH EVERY DAY FOR LOW THYROID, Disp: , Rfl:  .  metoprolol succinate (TOPROL-XL) 25 MG 24 hr tablet, Take 25 mg by mouth daily. for high blood pressure, Disp: , Rfl:  No current facility-administered medications for this visit.   Facility-Administered Medications Ordered in Other Visits:  .  cyanocobalamin ((VITAMIN B-12)) injection 1,000 mcg, 1,000 mcg, Intramuscular, Q30 days, Sindy Guadeloupe,  MD   Physical exam:  Vitals:   07/16/18 1351  BP: (!) 143/84  Pulse: (!) 111  Temp: (!) 96 F (35.6 C)  TempSrc: Tympanic  Weight: 169 lb 10.1 oz (76.9 kg)   Physical Exam Constitutional:      General: She is not in acute distress. HENT:     Head: Normocephalic and atraumatic.  Eyes:     Pupils: Pupils are equal, round, and reactive to light.  Neck:     Musculoskeletal: Normal range of motion.  Cardiovascular:     Rate and Rhythm: Normal rate and regular rhythm.     Heart sounds: Normal heart sounds.  Pulmonary:     Effort: Pulmonary effort is normal.     Breath sounds: Normal breath sounds.  Abdominal:     General: Bowel sounds are normal. There is no distension.     Palpations: Abdomen is soft.     Tenderness: There is no abdominal tenderness.  Lymphadenopathy:     Comments: No palpable cervical, supraclavicular, axillary or inguinal adenopathy   Skin:    General: Skin is warm and dry.  Neurological:     Mental Status: She is alert and oriented to person, place, and time.     Assessment and plan- Patient is a 68 y.o. female referred for macrocytic anemia  I do not have any prior CBCs for comparison to see how long the patient has been anemic.  I only have one set of CBC which shows  that her hemoglobin was 7.2 in May 2020.  Macrocytic anemia possibly secondary to B12 deficiency given the elevated intrinsic factor antibodies however I do not see any recent B12 level checked.  Today I will do a comprehensive anemia work-up including a CBC with differential, CMP, ferritin and iron studies, B12 and folate, TSH, free T3-T4, myeloma panel, serum free light chains, LDH, haptoglobin.  We will give her 1 dose of B12 today.  Video visit with Dr. Janese Banks in about 10 days time to discuss the results of blood work and further management   Thank you for this kind referral and the opportunity to participate in the care of this patient   Visit Diagnosis 1. Macrocytic anemia     Dr. Randa Evens, MD, MPH [facility] [phone] 07/16/2018 2:47 PM

## 2018-07-17 LAB — MULTIPLE MYELOMA PANEL, SERUM
Albumin SerPl Elph-Mcnc: 3.8 g/dL (ref 2.9–4.4)
Albumin/Glob SerPl: 1.1 (ref 0.7–1.7)
Alpha 1: 0.2 g/dL (ref 0.0–0.4)
Alpha2 Glob SerPl Elph-Mcnc: 1 g/dL (ref 0.4–1.0)
B-Globulin SerPl Elph-Mcnc: 1.2 g/dL (ref 0.7–1.3)
Gamma Glob SerPl Elph-Mcnc: 1.2 g/dL (ref 0.4–1.8)
Globulin, Total: 3.5 g/dL (ref 2.2–3.9)
IgA: 228 mg/dL (ref 87–352)
IgG (Immunoglobin G), Serum: 1256 mg/dL (ref 586–1602)
IgM (Immunoglobulin M), Srm: 75 mg/dL (ref 26–217)
Total Protein ELP: 7.3 g/dL (ref 6.0–8.5)

## 2018-07-17 LAB — KAPPA/LAMBDA LIGHT CHAINS
Kappa free light chain: 18.2 mg/L (ref 3.3–19.4)
Kappa, lambda light chain ratio: 1.21 (ref 0.26–1.65)
Lambda free light chains: 15 mg/L (ref 5.7–26.3)

## 2018-07-17 LAB — HAPTOGLOBIN: Haptoglobin: 238 mg/dL (ref 37–355)

## 2018-07-17 LAB — T3, FREE: T3, Free: 2.8 pg/mL (ref 2.0–4.4)

## 2018-07-26 ENCOUNTER — Other Ambulatory Visit: Payer: Self-pay

## 2018-07-26 ENCOUNTER — Inpatient Hospital Stay (HOSPITAL_BASED_OUTPATIENT_CLINIC_OR_DEPARTMENT_OTHER): Payer: Medicare HMO | Admitting: Oncology

## 2018-07-26 ENCOUNTER — Encounter: Payer: Self-pay | Admitting: Oncology

## 2018-07-26 DIAGNOSIS — R790 Abnormal level of blood mineral: Secondary | ICD-10-CM | POA: Diagnosis not present

## 2018-07-26 DIAGNOSIS — D510 Vitamin B12 deficiency anemia due to intrinsic factor deficiency: Secondary | ICD-10-CM

## 2018-07-26 NOTE — Progress Notes (Signed)
Pt to get lab results and see md for plan from results. No pain today per patient

## 2018-07-26 NOTE — Progress Notes (Signed)
I connected with April Middleton on 07/26/18 at 11:00 AM EDT by video enabled telemedicine visit and verified that I am speaking with the correct person using two identifiers.   I discussed the limitations, risks, security and privacy concerns of performing an evaluation and management service by telemedicine and the availability of in-person appointments. I also discussed with the patient that there may be a patient responsible charge related to this service. The patient expressed understanding and agreed to proceed.  There were problems with stable connection and visit had to be switched to telephone along the way  Other persons participating in the visit and their role in the encounter:  Patients grandson  Patient's location:  home Provider's location:  work   Diagnosis- macrocytic anemia secondary to b12 deficiency  Chief Complaint:  Discuss results of bloodwork  History of present illness: Patient is a 68 year old female with a past medical history significant for hypothyroidism and hyperlipidemia who has been referred to [place] for possible B12 deficiency.  She was seen by Dr. Bonna Gains from GI and underwent EGD and colonoscopy for elevated intrinsic factor antibodies and macrocytic anemia.  EGD and colonoscopy did not reveal any bleeding.  She did have a couple of polyps in her colon as well as stomach which were taken out and were negative for malignancy.  I Labs from [facility] from Jun 01, 2018 showed a normal white count, H&H of 7.2/20.5 with an MCV of 103 and a platelet count of 297.  b12 levels were less than 50. Intrinsic factor antibody was elevated at 64.  TSH was elevated at 13 with a normal free T4 at 1.24.  Her levothyroxine dose was increased from 75 mcg to 88 mcg.  She was also given for weekly B12 shots but I do not have any B12 value as such that I can see in the system.  Patient states that her energy levels were somewhat improved after she received the 4 weekly shots    Interval history: reports mild fatigue. Denies other complaints.    Review of Systems  Constitutional: Positive for malaise/fatigue. Negative for chills, fever and weight loss.  HENT: Negative for congestion, ear discharge and nosebleeds.   Eyes: Negative for blurred vision.  Respiratory: Negative for cough, hemoptysis, sputum production, shortness of breath and wheezing.   Cardiovascular: Negative for chest pain, palpitations, orthopnea and claudication.  Gastrointestinal: Negative for abdominal pain, blood in stool, constipation, diarrhea, heartburn, melena, nausea and vomiting.  Genitourinary: Negative for dysuria, flank pain, frequency, hematuria and urgency.  Musculoskeletal: Negative for back pain, joint pain and myalgias.  Skin: Negative for rash.  Neurological: Negative for dizziness, tingling, focal weakness, seizures, weakness and headaches.  Endo/Heme/Allergies: Does not bruise/bleed easily.  Psychiatric/Behavioral: Negative for depression and suicidal ideas. The patient does not have insomnia.     No Known Allergies  Past Medical History:  Diagnosis Date  . Hypertension   . Hypothyroidism   . Macrocytic anemia     Past Surgical History:  Procedure Laterality Date  . BREAST BIOPSY Left   . COLONOSCOPY WITH PROPOFOL N/A 06/28/2018   Procedure: COLONOSCOPY WITH PROPOFOL;  Surgeon: Virgel Manifold, MD;  Location: [facility] ENDOSCOPY;  Service: Endoscopy;  Laterality: N/A;  . ESOPHAGOGASTRODUODENOSCOPY (EGD) WITH PROPOFOL N/A 06/28/2018   Procedure: ESOPHAGOGASTRODUODENOSCOPY (EGD) WITH PROPOFOL;  Surgeon: Virgel Manifold, MD;  Location: [facility] ENDOSCOPY;  Service: Endoscopy;  Laterality: N/A;  . THYROIDECTOMY    . TUBAL LIGATION      Social History  Socioeconomic History  . Marital status: Married    Spouse name: Not on file  . Number of children: Not on file  . Years of education: Not on file  . Highest education level: Not on file  Occupational History   . Not on file  Social Needs  . Financial resource strain: Not on file  . Food insecurity    Worry: Not on file    Inability: Not on file  . Transportation needs    Medical: Not on file    Non-medical: Not on file  Tobacco Use  . Smoking status: Former Smoker    Packs/day: 1.00    Years: 20.00    Pack years: 20.00    Types: Cigarettes    Quit date: 07/15/2013    Years since quitting: 5.0  . Smokeless tobacco: Never Used  Substance and Sexual Activity  . Alcohol use: Yes    Comment: rare alcohol 1-2 times year  . Drug use: Never  . Sexual activity: Not on file  Lifestyle  . Physical activity    Days per week: Not on file    Minutes per session: Not on file  . Stress: Not on file  Relationships  . Social Herbalist on phone: Not on file    Gets together: Not on file    Attends religious service: Not on file    Active member of club or organization: Not on file    Attends meetings of clubs or organizations: Not on file    Relationship status: Not on file  . Intimate partner violence    Fear of current or ex partner: Not on file    Emotionally abused: Not on file    Physically abused: Not on file    Forced sexual activity: Not on file  Other Topics Concern  . Not on file  Social History Narrative  . Not on file    Family History  Problem Relation Age of Onset  . Cerebrovascular Accident Mother   . Cerebrovascular Accident Father   . Diabetes Sister      Current Outpatient Medications:  .  amitriptyline (ELAVIL) 25 MG tablet, 1 BY MOUTH NIGHTLY AT BEDTIME MAY INCREASE BY 25 MG IN ONE WEEK, Disp: , Rfl:  .  aspirin EC 81 MG tablet, Take 81 mg by mouth daily., Disp: , Rfl:  .  atorvastatin (LIPITOR) 20 MG tablet, Take 20 mg by mouth daily at 6 PM. , Disp: , Rfl:  .  butalbital-acetaminophen-caffeine (FIORICET) 50-325-40 MG tablet, TAKE 1 TO 2 TABLETS BY MOUTH EVERY 4 HOURS MAX 6 TABS DAILY, Disp: , Rfl:  .  cyclobenzaprine (FLEXERIL) 5 MG tablet, 1 TABLET  BY MOUTH TWICE DAILY AS NEEDED FOR PAIN SEPERATE BY 8 HOURS MONITOR FOR SEDATION, Disp: , Rfl:  .  fluticasone (FLONASE) 50 MCG/ACT nasal spray, Place 1 spray into both nostrils daily as needed. , Disp: , Rfl:  .  gabapentin (NEURONTIN) 100 MG capsule, Take 100 mg by mouth 3 (three) times daily., Disp: , Rfl:  .  hydrochlorothiazide (HYDRODIURIL) 25 MG tablet, Take 25 mg by mouth daily. for high blood pressure, Disp: , Rfl:  .  levothyroxine (SYNTHROID) 100 MCG tablet, Take 100 mcg by mouth daily before breakfast., Disp: , Rfl:  .  metoprolol succinate (TOPROL-XL) 25 MG 24 hr tablet, Take 25 mg by mouth daily. for high blood pressure, Disp: , Rfl:   No results found.  No images are attached to the encounter.  CMP Latest Ref Rng & Units 07/16/2018  Glucose 70 - 99 mg/dL 189(H)  BUN 8 - 23 mg/dL 18  Creatinine 0.44 - 1.00 mg/dL 0.66  Sodium 135 - 145 mmol/L 136  Potassium 3.5 - 5.1 mmol/L 3.3(L)  Chloride 98 - 111 mmol/L 96(L)  CO2 22 - 32 mmol/L 26  Calcium 8.9 - 10.3 mg/dL 9.2  Total Protein 6.5 - 8.1 g/dL 8.0  Total Bilirubin 0.3 - 1.2 mg/dL 0.6  Alkaline Phos 38 - 126 U/L 98  AST 15 - 41 U/L 19  ALT 0 - 44 U/L 21   CBC Latest Ref Rng & Units 07/16/2018  WBC 4.0 - 10.5 K/uL 13.0(H)  Hemoglobin 12.0 - 15.0 g/dL 12.4  Hematocrit 36.0 - 46.0 % 37.6  Platelets 150 - 400 K/uL 517(H)     Observation/objective: appears in no acute distress.   Assessment and plan:patient is a 68 yr old female with macrocytic anemia secondary to b12 deficiency  I discussed the results of anemia work up with the patient. Her hb has significantly improved from 7.2 to 12.4 she will continue monthly b12 shots. tsh t3 and t4 was normal after changing levothyroixine dose. Iron studies were normal but ferritin was low normal at 27. I have recommened that she should take OTC iron supplements. If ferritin does not improve, will consider IV iron  Suspect leucocytosis/ thrombocytosis reactive. Continue to monitor.    Follow-up instructions: b12 shots in 1 month, 2 month and 3 months. She will see me in 3 months with cbc ferritin and iron studies and b12.   I discussed the assessment and treatment plan with the patient. The patient was provided an opportunity to ask questions and all were answered. The patient agreed with the plan and demonstrated an understanding of the instructions.   The patient was advised to call back or seek an in-person evaluation if the symptoms worsen or if the condition fails to improve as anticipated.   Visit Diagnosis: 1. Vitamin B12 deficiency anemia due to intrinsic factor deficiency   2. Low ferritin     Dr. Randa Evens, MD, MPH [facility] [phone] 07/26/2018 1:08 PM

## 2018-07-31 ENCOUNTER — Encounter: Payer: Self-pay | Admitting: Gastroenterology

## 2018-08-14 ENCOUNTER — Other Ambulatory Visit: Payer: Self-pay

## 2018-08-14 ENCOUNTER — Ambulatory Visit (INDEPENDENT_AMBULATORY_CARE_PROVIDER_SITE_OTHER): Payer: Medicare HMO | Admitting: Gastroenterology

## 2018-08-14 ENCOUNTER — Encounter: Payer: Self-pay | Admitting: Gastroenterology

## 2018-08-14 VITALS — BP 169/94 | HR 81 | Temp 98.4°F | Ht 63.0 in | Wt 176.0 lb

## 2018-08-14 DIAGNOSIS — K3189 Other diseases of stomach and duodenum: Secondary | ICD-10-CM

## 2018-08-14 DIAGNOSIS — K31A0 Gastric intestinal metaplasia, unspecified: Secondary | ICD-10-CM

## 2018-08-14 NOTE — Progress Notes (Signed)
April Antigua, MD [address]  [place]  [place], [place] [address]  Main: [phone]  Fax: [phone]   Primary Care Physician: Marguerita Merles, MD   Chief Complaint  Patient presents with  . Follow-up    B-12 deficiency    HPI: April Middleton is a 68 y.o. female here for follow-up of anemia.  Patient found to have vitamin 12 deficiency and pernicious anemia and following with hematology and receiving B12 shots.  Anemia significantly improved with B12 replacement.  The patient denies abdominal or flank pain, anorexia, nausea or vomiting, dysphagia, change in bowel habits or black or bloody stools or weight loss.  EGD June 2020 with single nodule seen in stomach and biopsied, gastric erythema, gastric polyps biopsied, otherwise normal.  Colonoscopy with melanosis coli.  2 subcentimeter polyps removed.  Diverticulosis seen.  Surgical Pathology  CASE: [id number]  PATIENT: Edison Simon  Surgical Pathology Report      SPECIMEN SUBMITTED:  A. Duodenum, r/o celiac disease; cbx  B. Stomach nodule; cbx  C. Stomach, body, antrum, incisura, erythema; cbx  D. Stomach polyp; cbx  E. Colon, random, melanosis coli; cbx  F. Colon polyp, ascending, rectum polyp; cold snare   CLINICAL HISTORY:  68 year old female underwent evaluation for macrocytic anemia which  found elevated intrinsic factor antibody   PRE-OPERATIVE DIAGNOSIS:  Z12.11 screening colonoscopy; D64.9 anemia   POST-OPERATIVE DIAGNOSIS:  Gastric nodule, gastric erythema, colon and rectal polyp      DIAGNOSIS:  A. DUODENUM; COLD BIOPSY:  - DUODENAL MUCOSA WITH FOCAL MILD REACTIVE DUODENITIS.  - NEGATIVE FOR FEATURES OF CELIAC, DYSPLASIA, AND MALIGNANCY.   B. STOMACH NODULE; COLD BIOPSY:  - NODULAR AREA OF REACTIVE FOVEOLAR HYPERPLASIA CONSISTENT WITH HEALING  MUCOSAL INJURY.  - NEGATIVE FOR H. PYLORI, DYSPLASIA, AND MALIGNANCY.   C. STOMACH ERYTHEMA, BODY, ANTRUM, INCISURA; COLD  BIOPSY:  - FRAGMENT OF OXYNTIC MUCOSA WITH MODERATE CHRONIC GASTRITIS, GLANDULAR  DROPOUT, AND FOCAL INTESTINAL METAPLASIA, SEE COMMENT.  - ANTRAL MUCOSA WITH REACTIVE FOVEOLAR HYPERPLASIA, RARE GOBLET CELLS,  AND CHANGES CONSISTENT WITH REACTIVE GASTRITIS.  - NEGATIVE FOR H. PYLORI, DYSPLASIA, AND MALIGNANCY.   D. STOMACH POLYP; COLD BIOPSY:  - GASTRIC XANTHOMA, BENIGN.  - NEGATIVE FOR H. PYLORI, DYSPLASIA, AND MALIGNANCY.   E. COLON MELANOSIS COLI, RANDOM; COLD BIOPSY:  - COLONIC MUCOSA WITH MELANOSIS COLI.  - NEGATIVE FOR MICROSCOPIC COLITIS, DYSPLASIA, AND MALIGNANCY.   F. COLON POLYP, ASCENDING AND RECTAL POLYP; COLD SNARE:  - TUBULAR ADENOMA (2).  - COLONIC MUCOSA WITH MELANOSIS COLI.  - NEGATIVE FOR HIGH-GRADE DYSPLASIA AND MALIGNANCY.   Comment:  Given the patient's history of macrocytic anemia with elevated intrinsic  factor antibody the findings in the stomach biopsies (part C) are  compatible with autoimmune metaplastic atrophic gastritis.   Current Outpatient Medications  Medication Sig Dispense Refill  . amitriptyline (ELAVIL) 25 MG tablet 1 BY MOUTH NIGHTLY AT BEDTIME MAY INCREASE BY 25 MG IN ONE WEEK    . aspirin EC 81 MG tablet Take 81 mg by mouth daily.    Marland Kitchen atorvastatin (LIPITOR) 20 MG tablet Take 20 mg by mouth daily at 6 PM.     . butalbital-acetaminophen-caffeine (FIORICET) 50-325-40 MG tablet TAKE 1 TO 2 TABLETS BY MOUTH EVERY 4 HOURS MAX 6 TABS DAILY    . cyclobenzaprine (FLEXERIL) 5 MG tablet 1 TABLET BY MOUTH TWICE DAILY AS NEEDED FOR PAIN SEPERATE BY 8 HOURS MONITOR FOR SEDATION    . fluticasone (FLONASE)  50 MCG/ACT nasal spray Place 1 spray into both nostrils daily as needed.     . gabapentin (NEURONTIN) 100 MG capsule Take 100 mg by mouth 3 (three) times daily.    . hydrochlorothiazide (HYDRODIURIL) 25 MG tablet Take 25 mg by mouth daily. for high blood pressure    . levothyroxine (SYNTHROID) 100 MCG tablet Take 100 mcg by mouth daily before  breakfast.    . metoprolol succinate (TOPROL-XL) 25 MG 24 hr tablet Take 25 mg by mouth daily. for high blood pressure     No current facility-administered medications for this visit.     Allergies as of 08/14/2018  . (No Known Allergies)    ROS:  General: Negative for anorexia, weight loss, fever, chills, fatigue, weakness. ENT: Negative for hoarseness, difficulty swallowing , nasal congestion. CV: Negative for chest pain, angina, palpitations, dyspnea on exertion, peripheral edema.  Respiratory: Negative for dyspnea at rest, dyspnea on exertion, cough, sputum, wheezing.  GI: See history of present illness. GU:  Negative for dysuria, hematuria, urinary incontinence, urinary frequency, nocturnal urination.  Endo: Negative for unusual weight change.    Physical Examination:   BP (!) 169/94 Comment: has not taken B/P med. currently out. enc. pt to get med.  Pulse 81   Temp 98.4 F (36.9 C) (Oral)   Ht 5\' 3"  (1.6 m)   Wt 176 lb (79.8 kg)   BMI 31.18 kg/m   General: Well-nourished, well-developed in no acute distress.  Eyes: No icterus. Conjunctivae pink. Mouth: Oropharyngeal mucosa moist and pink , no lesions erythema or exudate. Neck: Supple, Trachea midline Abdomen: Bowel sounds are normal, nontender, nondistended, no hepatosplenomegaly or masses, no abdominal bruits or hernia , no rebound or guarding.   Extremities: No lower extremity edema. No clubbing or deformities. Neuro: Alert and oriented x 3.  Grossly intact. Skin: Warm and dry, no jaundice.   Psych: Alert and cooperative, normal mood and affect.   Labs: CMP     Component Value Date/Time   NA 136 07/16/2018 1439   K 3.3 (L) 07/16/2018 1439   CL 96 (L) 07/16/2018 1439   CO2 26 07/16/2018 1439   GLUCOSE 189 (H) 07/16/2018 1439   BUN 18 07/16/2018 1439   CREATININE 0.66 07/16/2018 1439   CALCIUM 9.2 07/16/2018 1439   PROT 8.0 07/16/2018 1439   ALBUMIN 4.5 07/16/2018 1439   AST 19 07/16/2018 1439   ALT 21  07/16/2018 1439   ALKPHOS 98 07/16/2018 1439   BILITOT 0.6 07/16/2018 1439   GFRNONAA >60 07/16/2018 1439   GFRAA >60 07/16/2018 1439   Lab Results  Component Value Date   WBC 13.0 (H) 07/16/2018   HGB 12.4 07/16/2018   HCT 37.6 07/16/2018   MCV 94.9 07/16/2018   PLT 517 (H) 07/16/2018    Imaging Studies: No results found.  Assessment and Plan:   April Middleton is a 68 y.o. y/o female with pernicious anemia  Continue B12 replacement with hematology  Finding of intestinal metaplasia on gastric biopsies discussed the need for repeat EGD for gastric mapping biopsies discussed.  Patient would like to schedule this in the next 3 to 4 weeks.  We will schedule.  I have discussed alternative options, risks & benefits,  which include, but are not limited to, bleeding, infection, perforation,respiratory complication & drug reaction.  The patient agrees with this plan & written consent will be obtained.    Repeat colonoscopy in 5 years from June 2020    Dr April Middleton

## 2018-08-24 ENCOUNTER — Other Ambulatory Visit: Payer: Self-pay

## 2018-08-27 ENCOUNTER — Inpatient Hospital Stay: Payer: Medicare HMO | Attending: Oncology

## 2018-08-27 ENCOUNTER — Other Ambulatory Visit: Payer: Self-pay

## 2018-08-27 DIAGNOSIS — D510 Vitamin B12 deficiency anemia due to intrinsic factor deficiency: Secondary | ICD-10-CM | POA: Diagnosis not present

## 2018-08-27 DIAGNOSIS — D539 Nutritional anemia, unspecified: Secondary | ICD-10-CM

## 2018-08-27 MED ORDER — CYANOCOBALAMIN 1000 MCG/ML IJ SOLN
1000.0000 ug | INTRAMUSCULAR | Status: DC
  Administered 2018-08-27: 1000 ug via INTRAMUSCULAR
  Filled 2018-08-27: qty 1

## 2018-09-18 ENCOUNTER — Other Ambulatory Visit: Admission: RE | Admit: 2018-09-18 | Payer: Medicare HMO | Source: Ambulatory Visit

## 2018-09-18 ENCOUNTER — Telehealth: Payer: Self-pay

## 2018-09-18 NOTE — Telephone Encounter (Signed)
Called patient and patient states she has to rescheduled the procedure because her son lost 2 of his fingers and she is keeping her grandson right now while he heals. Patient states she will call and rescheduled this when everything settles down. Called Trish in ENDO and canceled the procedure.

## 2018-09-18 NOTE — Telephone Encounter (Signed)
Patient called and left a message on voicemail to rescheduled her EGD on Friday 09/21/2018. Tried to call patient back but voicemail is full

## 2018-09-21 ENCOUNTER — Ambulatory Visit: Admit: 2018-09-21 | Payer: Medicare HMO | Admitting: Gastroenterology

## 2018-09-21 SURGERY — ESOPHAGOGASTRODUODENOSCOPY (EGD) WITH PROPOFOL
Anesthesia: General

## 2018-09-26 ENCOUNTER — Other Ambulatory Visit: Payer: Self-pay

## 2018-09-27 ENCOUNTER — Other Ambulatory Visit: Payer: Self-pay

## 2018-09-27 ENCOUNTER — Inpatient Hospital Stay: Payer: Medicare HMO | Attending: Oncology

## 2018-09-27 DIAGNOSIS — D539 Nutritional anemia, unspecified: Secondary | ICD-10-CM

## 2018-09-27 DIAGNOSIS — D510 Vitamin B12 deficiency anemia due to intrinsic factor deficiency: Secondary | ICD-10-CM | POA: Diagnosis present

## 2018-09-27 MED ORDER — CYANOCOBALAMIN 1000 MCG/ML IJ SOLN
1000.0000 ug | INTRAMUSCULAR | Status: DC
  Administered 2018-09-27: 1000 ug via INTRAMUSCULAR
  Filled 2018-09-27: qty 1

## 2018-10-14 MED ORDER — ARTIFICIAL TEARS OPHTHALMIC OINT
TOPICAL_OINTMENT | OPHTHALMIC | Status: AC
  Filled 2018-10-14: qty 3.5

## 2018-10-14 MED ORDER — PROPOFOL 10 MG/ML IV BOLUS
INTRAVENOUS | Status: AC
  Filled 2018-10-14: qty 20

## 2018-10-14 MED ORDER — ONDANSETRON HCL 4 MG/2ML IJ SOLN
INTRAMUSCULAR | Status: AC
  Filled 2018-10-14: qty 4

## 2018-10-14 MED ORDER — FENTANYL CITRATE (PF) 100 MCG/2ML IJ SOLN
INTRAMUSCULAR | Status: AC
  Filled 2018-10-14: qty 2

## 2018-10-14 MED ORDER — LIDOCAINE HCL (PF) 2 % IJ SOLN
INTRAMUSCULAR | Status: AC
  Filled 2018-10-14: qty 10

## 2018-10-26 ENCOUNTER — Inpatient Hospital Stay: Payer: Medicare HMO | Admitting: Oncology

## 2018-10-26 ENCOUNTER — Other Ambulatory Visit: Payer: Medicare HMO

## 2018-10-26 ENCOUNTER — Inpatient Hospital Stay: Payer: Medicare HMO

## 2018-10-26 ENCOUNTER — Ambulatory Visit: Payer: Medicare HMO | Admitting: Oncology

## 2018-10-26 ENCOUNTER — Ambulatory Visit: Payer: Medicare HMO

## 2019-05-03 ENCOUNTER — Other Ambulatory Visit: Payer: Self-pay | Admitting: Family Medicine

## 2019-05-03 DIAGNOSIS — Z1231 Encounter for screening mammogram for malignant neoplasm of breast: Secondary | ICD-10-CM

## 2019-06-03 ENCOUNTER — Ambulatory Visit
Admission: RE | Admit: 2019-06-03 | Discharge: 2019-06-03 | Disposition: A | Discharge status: Home / Self Care | Payer: Medicare HMO | Source: Ambulatory Visit | Attending: Family Medicine | Admitting: Family Medicine

## 2019-06-03 DIAGNOSIS — Z1231 Encounter for screening mammogram for malignant neoplasm of breast: Secondary | ICD-10-CM | POA: Diagnosis present

## 2019-06-05 ENCOUNTER — Other Ambulatory Visit: Payer: Self-pay | Admitting: Family Medicine

## 2019-06-05 DIAGNOSIS — N6489 Other specified disorders of breast: Secondary | ICD-10-CM

## 2019-06-05 DIAGNOSIS — R928 Other abnormal and inconclusive findings on diagnostic imaging of breast: Secondary | ICD-10-CM

## 2019-06-11 ENCOUNTER — Ambulatory Visit
Admission: RE | Admit: 2019-06-11 | Discharge: 2019-06-11 | Disposition: A | Discharge status: Home / Self Care | Payer: Medicare HMO | Source: Ambulatory Visit | Attending: Family Medicine | Admitting: Family Medicine

## 2019-06-11 DIAGNOSIS — N6489 Other specified disorders of breast: Secondary | ICD-10-CM | POA: Diagnosis present

## 2019-06-11 DIAGNOSIS — R928 Other abnormal and inconclusive findings on diagnostic imaging of breast: Secondary | ICD-10-CM | POA: Insufficient documentation

## 2019-06-17 ENCOUNTER — Other Ambulatory Visit: Payer: Self-pay | Admitting: Family Medicine

## 2019-06-17 DIAGNOSIS — N6489 Other specified disorders of breast: Secondary | ICD-10-CM

## 2019-06-17 DIAGNOSIS — R928 Other abnormal and inconclusive findings on diagnostic imaging of breast: Secondary | ICD-10-CM

## 2019-06-18 ENCOUNTER — Ambulatory Visit
Admission: RE | Admit: 2019-06-18 | Discharge: 2019-06-18 | Disposition: A | Discharge status: Home / Self Care | Payer: Medicare HMO | Source: Ambulatory Visit | Attending: Family Medicine | Admitting: Family Medicine

## 2019-06-18 DIAGNOSIS — R928 Other abnormal and inconclusive findings on diagnostic imaging of breast: Secondary | ICD-10-CM

## 2019-06-18 DIAGNOSIS — N6489 Other specified disorders of breast: Secondary | ICD-10-CM

## 2019-06-21 LAB — SURGICAL PATHOLOGY

## 2020-12-15 ENCOUNTER — Other Ambulatory Visit: Payer: Self-pay | Admitting: Family Medicine

## 2020-12-15 DIAGNOSIS — N6320 Unspecified lump in the left breast, unspecified quadrant: Secondary | ICD-10-CM

## 2021-06-08 ENCOUNTER — Other Ambulatory Visit: Payer: Self-pay | Admitting: Family Medicine

## 2021-06-08 DIAGNOSIS — Z1231 Encounter for screening mammogram for malignant neoplasm of breast: Secondary | ICD-10-CM

## 2021-06-08 DIAGNOSIS — N6489 Other specified disorders of breast: Secondary | ICD-10-CM

## 2021-06-09 ENCOUNTER — Ambulatory Visit
Admission: RE | Admit: 2021-06-09 | Discharge: 2021-06-09 | Disposition: A | Discharge status: Home / Self Care | Payer: Medicare Other | Source: Ambulatory Visit | Attending: Family Medicine | Admitting: Family Medicine

## 2021-06-09 ENCOUNTER — Ambulatory Visit
Admission: RE | Admit: 2021-06-09 | Discharge: 2021-06-09 | Disposition: A | Discharge status: Home / Self Care | Payer: Medicare Other | Attending: Family Medicine | Admitting: Family Medicine

## 2021-06-09 ENCOUNTER — Other Ambulatory Visit: Payer: Self-pay | Admitting: Family Medicine

## 2021-06-09 ENCOUNTER — Encounter: Payer: Self-pay | Admitting: Oncology

## 2021-06-09 DIAGNOSIS — M25511 Pain in right shoulder: Secondary | ICD-10-CM

## 2021-07-09 ENCOUNTER — Other Ambulatory Visit: Payer: Medicare Other

## 2021-07-09 ENCOUNTER — Inpatient Hospital Stay: Admission: RE | Admit: 2021-07-09 | Payer: Medicare Other | Source: Ambulatory Visit

## 2021-08-02 ENCOUNTER — Ambulatory Visit
Admission: RE | Admit: 2021-08-02 | Discharge: 2021-08-02 | Disposition: A | Discharge status: Home / Self Care | Payer: Medicare Other | Source: Ambulatory Visit | Attending: Family Medicine | Admitting: Family Medicine

## 2021-08-02 DIAGNOSIS — N6489 Other specified disorders of breast: Secondary | ICD-10-CM | POA: Diagnosis present

## 2021-08-02 DIAGNOSIS — Z1231 Encounter for screening mammogram for malignant neoplasm of breast: Secondary | ICD-10-CM

## 2022-02-22 ENCOUNTER — Encounter: Payer: Self-pay | Admitting: Oncology

## 2022-03-15 ENCOUNTER — Encounter (INDEPENDENT_AMBULATORY_CARE_PROVIDER_SITE_OTHER): Payer: Self-pay | Admitting: Nurse Practitioner

## 2022-03-15 ENCOUNTER — Ambulatory Visit (INDEPENDENT_AMBULATORY_CARE_PROVIDER_SITE_OTHER): Payer: 59 | Admitting: Nurse Practitioner

## 2022-03-15 VITALS — BP 154/82 | HR 93 | Resp 18 | Ht 63.0 in | Wt 145.0 lb

## 2022-03-15 DIAGNOSIS — M79605 Pain in left leg: Secondary | ICD-10-CM

## 2022-03-16 ENCOUNTER — Other Ambulatory Visit (INDEPENDENT_AMBULATORY_CARE_PROVIDER_SITE_OTHER): Payer: Self-pay | Admitting: Nurse Practitioner

## 2022-03-16 ENCOUNTER — Encounter (INDEPENDENT_AMBULATORY_CARE_PROVIDER_SITE_OTHER): Payer: Self-pay | Admitting: Nurse Practitioner

## 2022-03-16 DIAGNOSIS — M79605 Pain in left leg: Secondary | ICD-10-CM

## 2022-03-16 NOTE — Progress Notes (Signed)
Subjective:    Patient ID: April Middleton, female    DOB: 03/27/1950, 72 y.o.   MRN: [id number] Chief Complaint  Patient presents with   New Patient (Initial Visit)    np. consult. needs evaluation of left calf pain + non smoker + med hx includes dm + hx of pernicous anemia. referred by miles, linda.      April Middleton is a 72 year old female who presents as a referral from Dr. Lennox Grumbles with concern of pain in her left lower extremity.  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  She has no neuropathy in her hands and her feet.  She describes her current pain as a worsening of the pain that she has with her neuropathy.  She notes that the pain is also a throbbing sensation.  She notes that it occurs from her ankles to her knees but not in her thighs.  This happens randomly.  She is not sure exactly how long it last when it occurs.  She also has been ongoing over the last few months.  She has tried over-the-counter analgesics nothing has helped.  Currently no evidence of cellulitis.  The patient does have some known back issues.  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  She denies any symptoms that are consistent with claudication or rest pain currently.    Review of Systems  Neurological:  Positive for numbness.  All other systems reviewed and are negative.      Objective:   Physical Exam Vitals reviewed.  HENT:     Head: Normocephalic.  Cardiovascular:     Rate and Rhythm: Normal rate.     Pulses:          Dorsalis pedis pulses are 2+ on the right side and 1+ on the left side.       Posterior tibial pulses are 2+ on the right side and 2+ on the left side.  Pulmonary:     Effort: Pulmonary effort is normal.  Skin:    General: Skin is warm and dry.     Capillary Refill: Capillary refill takes more than 3 seconds.  Neurological:     Mental Status: She is alert and  oriented to person, place, and time.  Psychiatric:        Mood and Affect: Mood normal.        Behavior: Behavior normal.        Thought Content: Thought content normal.        Judgment: Judgment normal.     BP (!) 154/82 (BP Location: Right Arm)   Pulse 93   Resp 18   Ht '5\' 3"'$  (1.6 m)   Wt 145 lb (65.8 kg)   BMI 25.69 kg/m   Past Medical History:  Diagnosis Date   Hypertension    Hypothyroidism    Macrocytic anemia     Social History   Socioeconomic History   Marital status: Married    Spouse name: Not on file   Number of children: Not on file   Years of education: Not on file   Highest education level: Not on file  Occupational History   Not on file  Tobacco Use   Smoking status: Former    Packs/day: 1.00    Years: 20.00    Total pack years: 20.00    Types: Cigarettes    Quit date: 07/15/2013  Years since quitting: 8.6   Smokeless tobacco: Never  Vaping Use   Vaping Use: Never used  Substance and Sexual Activity   Alcohol use: Yes    Comment: rare alcohol 1-2 times year   Drug use: Never   Sexual activity: Not on file  Other Topics Concern   Not on file  Social History Narrative   Not on file   Social Determinants of Health   Financial Resource Strain: Not on file  Food Insecurity: Not on file  Transportation Needs: Not on file  Physical Activity: Not on file  Stress: Not on file  Social Connections: Not on file  Intimate Partner Violence: Not on file    Past Surgical History:  Procedure Laterality Date   BREAST BIOPSY Left    BREAST BIOPSY Left 06/19/2019   Affirm Bx- X-clip neg   COLONOSCOPY WITH PROPOFOL N/A 06/28/2018   Procedure: COLONOSCOPY WITH PROPOFOL;  Surgeon: Virgel Manifold, MD;  Location: [facility] ENDOSCOPY;  Service: Endoscopy;  Laterality: N/A;   ESOPHAGOGASTRODUODENOSCOPY (EGD) WITH PROPOFOL N/A 06/28/2018   Procedure: ESOPHAGOGASTRODUODENOSCOPY (EGD) WITH PROPOFOL;  Surgeon: Virgel Manifold, MD;  Location: [facility]  ENDOSCOPY;  Service: Endoscopy;  Laterality: N/A;   THYROIDECTOMY     TUBAL LIGATION      Family History  Problem Relation Age of Onset   Cerebrovascular Accident Mother    Cerebrovascular Accident Father    Diabetes Sister     No Known Allergies     Latest Ref Rng & Units 07/16/2018    2:39 PM  CBC  WBC 4.0 - 10.5 K/uL 13.0   Hemoglobin 12.0 - 15.0 g/dL 12.4   Hematocrit 36.0 - 46.0 % 37.6   Platelets 150 - 400 K/uL 517       CMP     Component Value Date/Time   NA 136 07/16/2018 1439   K 3.3 (L) 07/16/2018 1439   CL 96 (L) 07/16/2018 1439   CO2 26 07/16/2018 1439   GLUCOSE 189 (H) 07/16/2018 1439   BUN 18 07/16/2018 1439   CREATININE 0.66 07/16/2018 1439   CALCIUM 9.2 07/16/2018 1439   PROT 8.0 07/16/2018 1439   ALBUMIN 4.5 07/16/2018 1439   AST 19 07/16/2018 1439   ALT 21 07/16/2018 1439   ALKPHOS 98 07/16/2018 1439   BILITOT 0.6 07/16/2018 1439   GFRNONAA >60 07/16/2018 1439   GFRAA >60 07/16/2018 1439     No results found.     Assessment & Plan:   1. Pain of left lower extremity I discussed possible options with the patient regarding her symptoms.  The largest concern is of arterial perfusion causing the worsening numb and tingling sensation.  The patient may also have some varicosities which can exacerbate symptoms of numbness and tingling when there is significant venous reflux.  Will plan to have the patient return for both ABIs and venous reflux studies.  I also discussed that this may be related to her lower back or a possible worsening of neuropathy caused by her pernicious anemia.  Further recommendations will be made following ultrasounds.   Current Outpatient Medications on File Prior to Visit  Medication Sig Dispense Refill   amitriptyline (ELAVIL) 25 MG tablet 1 BY MOUTH NIGHTLY AT BEDTIME MAY INCREASE BY 25 MG IN ONE WEEK     aspirin EC 81 MG tablet Take 81 mg by mouth daily.     atorvastatin (LIPITOR) 40 MG tablet Take 40 mg by mouth daily.      butalbital-acetaminophen-caffeine (  FIORICET) 50-325-40 MG tablet TAKE 1 TO 2 TABLETS BY MOUTH EVERY 4 HOURS MAX 6 TABS DAILY     cetirizine (ZYRTEC) 10 MG tablet Take by mouth.     fluticasone (FLONASE) 50 MCG/ACT nasal spray Place 1 spray into both nostrils daily as needed.      hydrochlorothiazide (HYDRODIURIL) 25 MG tablet Take 25 mg by mouth daily. for high blood pressure     levothyroxine (SYNTHROID) 100 MCG tablet Take 100 mcg by mouth daily before breakfast.     metFORMIN (GLUCOPHAGE) 500 MG tablet Take by mouth.     metoprolol succinate (TOPROL-XL) 25 MG 24 hr tablet Take 25 mg by mouth daily. for high blood pressure     cyclobenzaprine (FLEXERIL) 5 MG tablet 1 TABLET BY MOUTH TWICE DAILY AS NEEDED FOR PAIN SEPERATE BY 8 HOURS MONITOR FOR SEDATION (Patient not taking: Reported on 03/15/2022)     gabapentin (NEURONTIN) 100 MG capsule Take 100 mg by mouth 3 (three) times daily. (Patient not taking: Reported on 03/15/2022)     No current facility-administered medications on file prior to visit.    There are no Patient Instructions on file for this visit. No follow-ups on file.   Kris Hartmann, NP

## 2022-03-17 ENCOUNTER — Encounter (INDEPENDENT_AMBULATORY_CARE_PROVIDER_SITE_OTHER): Payer: Self-pay | Admitting: Nurse Practitioner

## 2022-03-17 ENCOUNTER — Ambulatory Visit (INDEPENDENT_AMBULATORY_CARE_PROVIDER_SITE_OTHER): Payer: 59

## 2022-03-17 ENCOUNTER — Ambulatory Visit (INDEPENDENT_AMBULATORY_CARE_PROVIDER_SITE_OTHER): Payer: 59 | Admitting: Nurse Practitioner

## 2022-03-17 VITALS — BP 163/71 | HR 83 | Resp 16 | Ht 63.0 in | Wt 145.0 lb

## 2022-03-17 DIAGNOSIS — M79605 Pain in left leg: Secondary | ICD-10-CM

## 2022-03-17 DIAGNOSIS — D510 Vitamin B12 deficiency anemia due to intrinsic factor deficiency: Secondary | ICD-10-CM | POA: Diagnosis not present

## 2022-03-17 NOTE — Progress Notes (Signed)
Subjective:    Patient ID: April Middleton, female    DOB: 1950-06-25, 72 y.o.   MRN: [id number] Chief Complaint  Patient presents with   Follow-up    ultrasound    April Middleton is a 72 year old female who returns today for follow-up evaluation regarding pain in her left lower extremity.  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  She has no neuropathy in her hands and her feet.  She describes her current pain as a worsening of the pain that she has with her neuropathy.  She notes that the pain is also a throbbing sensation.  She notes that it occurs from her ankles to her knees but not in her thighs.  This happens randomly.  She is not sure exactly how long it last when it occurs.  She also has been ongoing over the last few months.  She has tried over-the-counter analgesics nothing has helped.  Currently no evidence of cellulitis.  The patient does have some known back issues.  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  She denies any symptoms that are consistent with claudication or rest pain currently.  Today noninvasive studies show an ABI of 1.10 on the right and 1.10 on the left.  The patient has normal TBI's bilaterally.  She has strong triphasic tibial artery waveforms bilaterally with normal toe waveforms bilaterally.  The patient has no DVT or superficial venous reflux in the left lower extremity.  No evidence of deep venous insufficiency.  No superficial phlebitis.    Review of Systems  Neurological:  Positive for numbness.  All other systems reviewed and are negative.      Objective:   Physical Exam Vitals reviewed.  HENT:     Head: Normocephalic.  Cardiovascular:     Rate and Rhythm: Normal rate.     Pulses:          Dorsalis pedis pulses are 2+ on the right side and 1+ on the left side.       Posterior tibial pulses are 2+ on the right side and 2+ on  the left side.  Pulmonary:     Effort: Pulmonary effort is normal.  Skin:    General: Skin is warm and dry.  Neurological:     Mental Status: She is alert and oriented to person, place, and time.  Psychiatric:        Mood and Affect: Mood normal.        Behavior: Behavior normal.        Thought Content: Thought content normal.        Judgment: Judgment normal.     BP (!) 163/71 (BP Location: Left Arm)   Pulse 83   Resp 16   Ht 5\' 3"  (1.6 m)   Wt 145 lb (65.8 kg)   BMI 25.69 kg/m   Past Medical History:  Diagnosis Date   Hypertension    Hypothyroidism    Macrocytic anemia     Social History   Socioeconomic History   Marital status: Married    Spouse name: Not on file   Number of children: Not on file   Years of education: Not on file   Highest education level: Not on file  Occupational History   Not on file  Tobacco Use   Smoking status: Former    Packs/day: 1.00    Years: 20.00  Total pack years: 20.00    Types: Cigarettes    Quit date: 07/15/2013    Years since quitting: 8.6   Smokeless tobacco: Never  Vaping Use   Vaping Use: Never used  Substance and Sexual Activity   Alcohol use: Yes    Comment: rare alcohol 1-2 times year   Drug use: Never   Sexual activity: Not on file  Other Topics Concern   Not on file  Social History Narrative   Not on file   Social Determinants of Health   Financial Resource Strain: Not on file  Food Insecurity: Not on file  Transportation Needs: Not on file  Physical Activity: Not on file  Stress: Not on file  Social Connections: Not on file  Intimate Partner Violence: Not on file    Past Surgical History:  Procedure Laterality Date   BREAST BIOPSY Left    BREAST BIOPSY Left 06/19/2019   Affirm Bx- X-clip neg   COLONOSCOPY WITH PROPOFOL N/A 06/28/2018   Procedure: COLONOSCOPY WITH PROPOFOL;  Surgeon: Virgel Manifold, MD;  Location: [facility] ENDOSCOPY;  Service: Endoscopy;  Laterality: N/A;    ESOPHAGOGASTRODUODENOSCOPY (EGD) WITH PROPOFOL N/A 06/28/2018   Procedure: ESOPHAGOGASTRODUODENOSCOPY (EGD) WITH PROPOFOL;  Surgeon: Virgel Manifold, MD;  Location: [facility] ENDOSCOPY;  Service: Endoscopy;  Laterality: N/A;   THYROIDECTOMY     TUBAL LIGATION      Family History  Problem Relation Age of Onset   Cerebrovascular Accident Mother    Cerebrovascular Accident Father    Diabetes Sister     No Known Allergies     Latest Ref Rng & Units 07/16/2018    2:39 PM  CBC  WBC 4.0 - 10.5 K/uL 13.0   Hemoglobin 12.0 - 15.0 g/dL 12.4   Hematocrit 36.0 - 46.0 % 37.6   Platelets 150 - 400 K/uL 517       CMP     Component Value Date/Time   NA 136 07/16/2018 1439   K 3.3 (L) 07/16/2018 1439   CL 96 (L) 07/16/2018 1439   CO2 26 07/16/2018 1439   GLUCOSE 189 (H) 07/16/2018 1439   BUN 18 07/16/2018 1439   CREATININE 0.66 07/16/2018 1439   CALCIUM 9.2 07/16/2018 1439   PROT 8.0 07/16/2018 1439   ALBUMIN 4.5 07/16/2018 1439   AST 19 07/16/2018 1439   ALT 21 07/16/2018 1439   ALKPHOS 98 07/16/2018 1439   BILITOT 0.6 07/16/2018 1439   GFRNONAA >60 07/16/2018 1439   GFRAA >60 07/16/2018 1439     No results found.     Assessment & Plan:   1. Pain of left lower extremity Recommend:  The patient has atypical pain symptoms for vascular disease and on exam I do not find evidence of vascular pathology that would explain the patient's symptoms.  Noninvasive studies do not identify significant vascular problems  I suspect the patient is c/o pseudoclaudication.  Patient should have an evaluation of the LS spine which I defer to the primary service  The patient should continue walking and begin a more formal exercise program. The patient should continue his antiplatelet therapy and aggressive treatment of the lipid abnormalities.  Patient will follow-up with me on a PRN basis.  2. Vitamin B12 deficiency anemia due to intrinsic factor deficiency This may also be a worsening of  her neuropathy.  Patient may benefit from neurology referral for treatment of neuropathy.   Current Outpatient Medications on File Prior to Visit  Medication Sig Dispense Refill  amitriptyline (ELAVIL) 25 MG tablet 1 BY MOUTH NIGHTLY AT BEDTIME MAY INCREASE BY 25 MG IN ONE WEEK     aspirin EC 81 MG tablet Take 81 mg by mouth daily.     atorvastatin (LIPITOR) 40 MG tablet Take 40 mg by mouth daily.     butalbital-acetaminophen-caffeine (FIORICET) 50-325-40 MG tablet TAKE 1 TO 2 TABLETS BY MOUTH EVERY 4 HOURS MAX 6 TABS DAILY     cetirizine (ZYRTEC) 10 MG tablet Take by mouth.     fluticasone (FLONASE) 50 MCG/ACT nasal spray Place 1 spray into both nostrils daily as needed.      hydrochlorothiazide (HYDRODIURIL) 25 MG tablet Take 25 mg by mouth daily. for high blood pressure     levothyroxine (SYNTHROID) 100 MCG tablet Take 100 mcg by mouth daily before breakfast.     metFORMIN (GLUCOPHAGE) 500 MG tablet Take by mouth.     metoprolol succinate (TOPROL-XL) 25 MG 24 hr tablet Take 25 mg by mouth daily. for high blood pressure     cyclobenzaprine (FLEXERIL) 5 MG tablet 1 TABLET BY MOUTH TWICE DAILY AS NEEDED FOR PAIN SEPERATE BY 8 HOURS MONITOR FOR SEDATION (Patient not taking: Reported on 03/15/2022)     gabapentin (NEURONTIN) 100 MG capsule Take 100 mg by mouth 3 (three) times daily. (Patient not taking: Reported on 03/15/2022)     No current facility-administered medications on file prior to visit.    There are no Patient Instructions on file for this visit. No follow-ups on file.   Kris Hartmann, NP

## 2022-03-18 ENCOUNTER — Encounter (INDEPENDENT_AMBULATORY_CARE_PROVIDER_SITE_OTHER): Payer: Self-pay | Admitting: Nurse Practitioner

## 2022-03-21 LAB — VAS US ABI WITH/WO TBI
Left ABI: 1.1
Right ABI: 1.1

## 2022-04-06 IMAGING — US US BREAST*L* LIMITED INC AXILLA
1 series · 4 of 4 positions shown · non-contrast
Comparison: Previous exam(s).

CLINICAL DATA: Patient was called back for left breast focal
asymmetry.

EXAM:
DIGITAL DIAGNOSTIC LEFT MAMMOGRAM WITH TOMO
ULTRASOUND LEFT BREAST

[Series 1: us breast*left* limited inc axilla · 0.06mm/px · 4 of 4 slices shown]
[im 1/4]
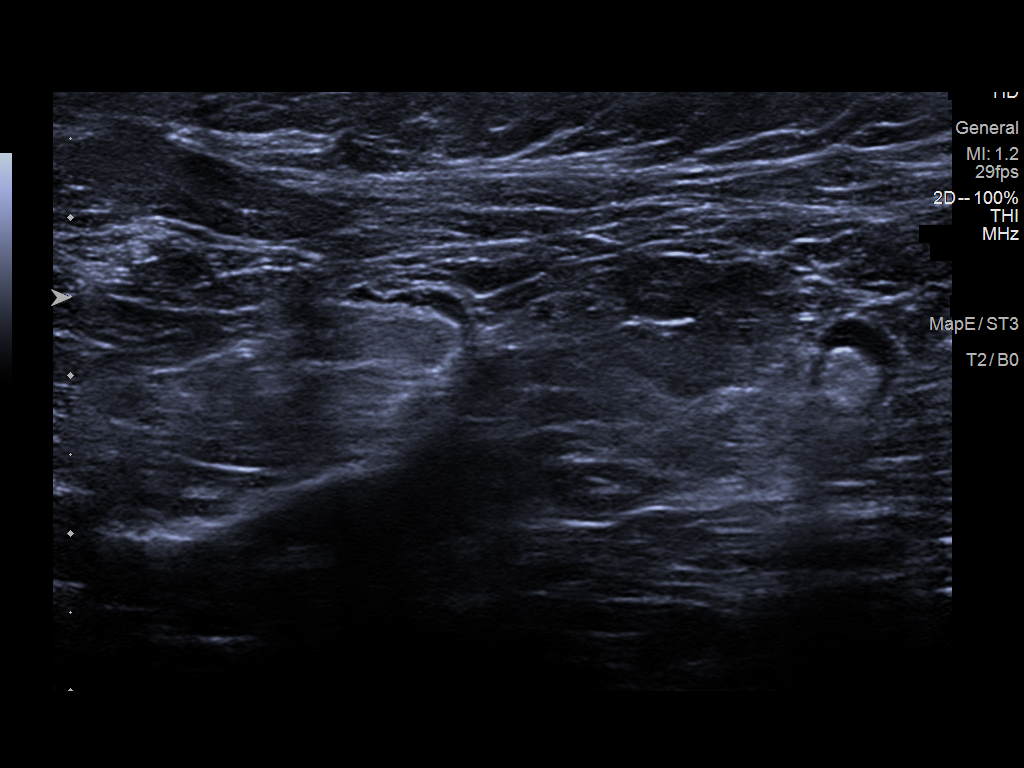
[im 2/4]
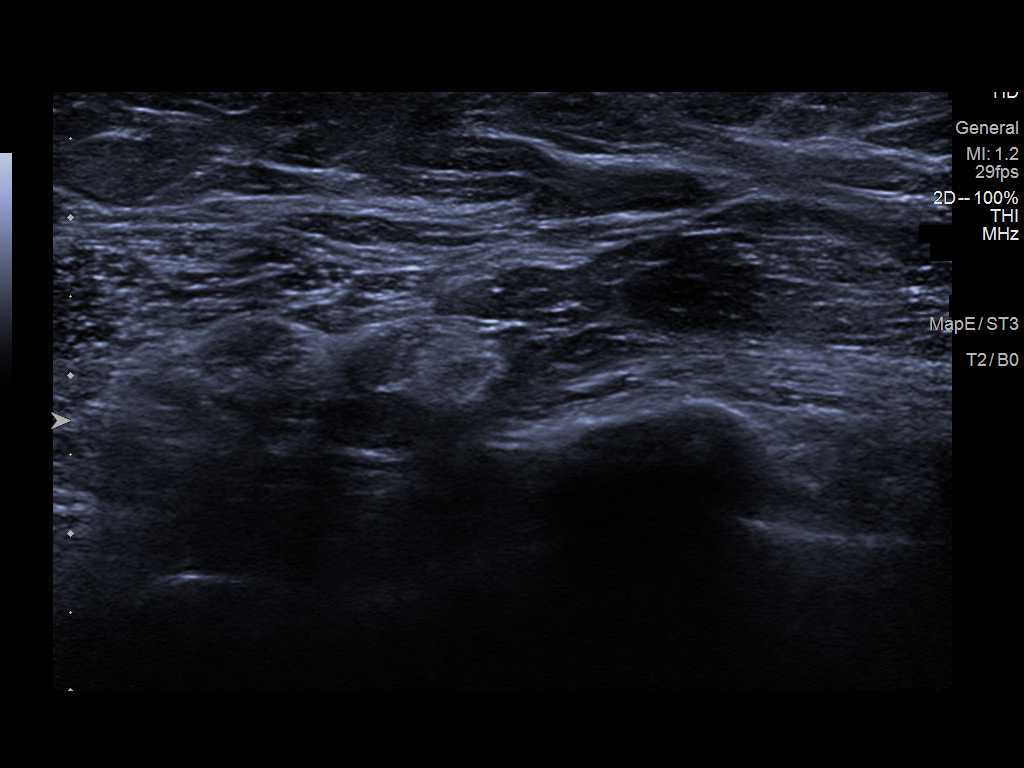
[im 3/4]
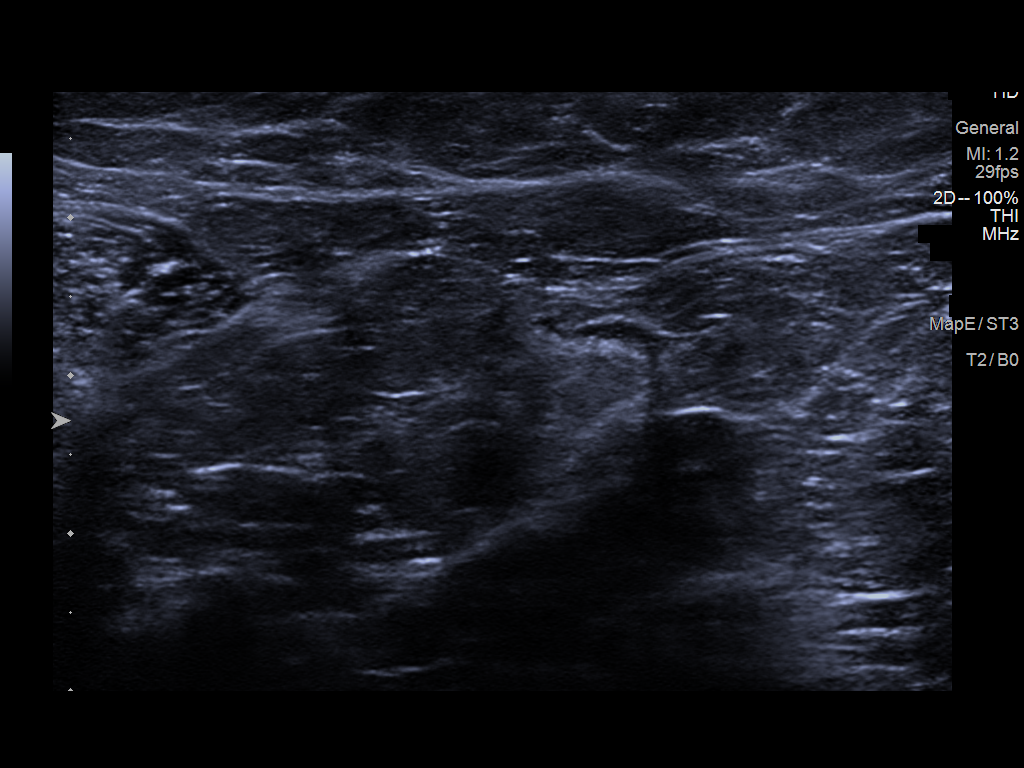
[im 4/4]
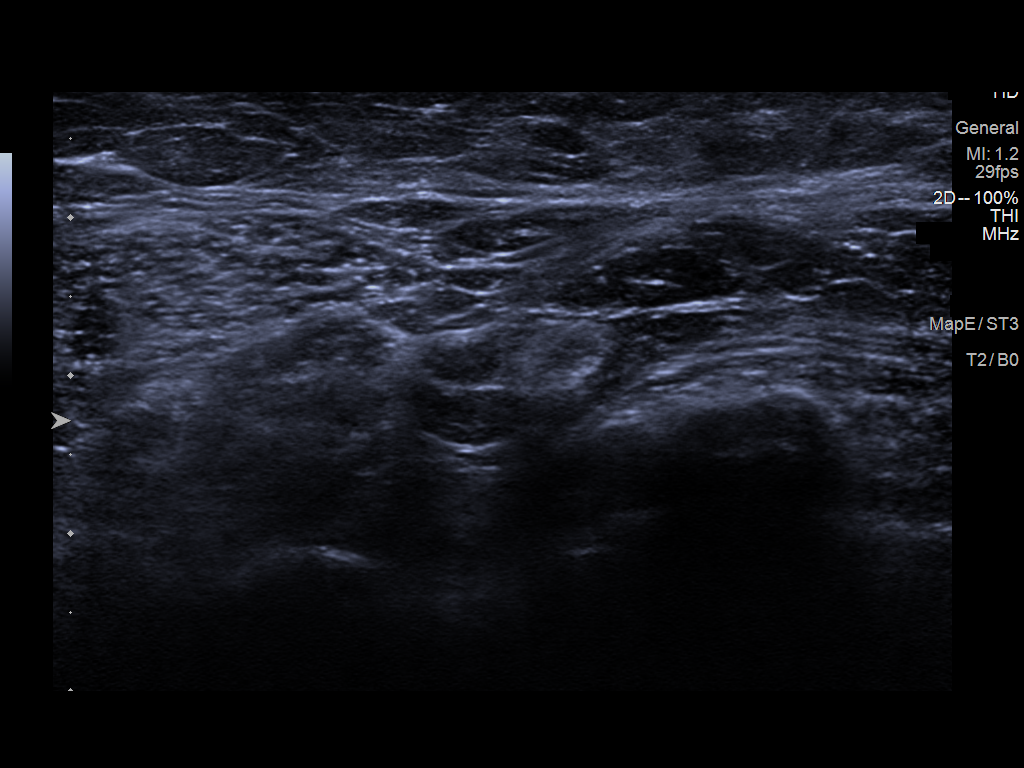

[4 of 4 positions shown; findings below may reference images not displayed]

ACR Breast Density Category b: There are scattered areas of
fibroglandular density.
FINDINGS: Focal asymmetry in the upper outer left breast persists on
additional imaging.

On physical exam, no suspicious lumps are identified.

Targeted ultrasound is performed, showing no sonographic correlate
for the left breast focal asymmetry.
IMPRESSION: Indeterminate left breast focal asymmetry.

RECOMMENDATION:
Stereotactic biopsy of the left breast focal asymmetry.

I have discussed the findings and recommendations with the patient.
If applicable, a reminder letter will be sent to the patient
regarding the next appointment.

BI-RADS CATEGORY  4: Suspicious.

## 2022-04-06 IMAGING — MG MM DIGITAL DIAGNOSTIC UNILAT*L* W/ TOMO W/ CAD
4 series · 4 of 12 positions shown · non-contrast
Comparison: Previous exam(s).

CLINICAL DATA: Patient was called back for left breast focal
asymmetry.

EXAM:
DIGITAL DIAGNOSTIC LEFT MAMMOGRAM WITH TOMO
ULTRASOUND LEFT BREAST

[L MLO synth-2D]
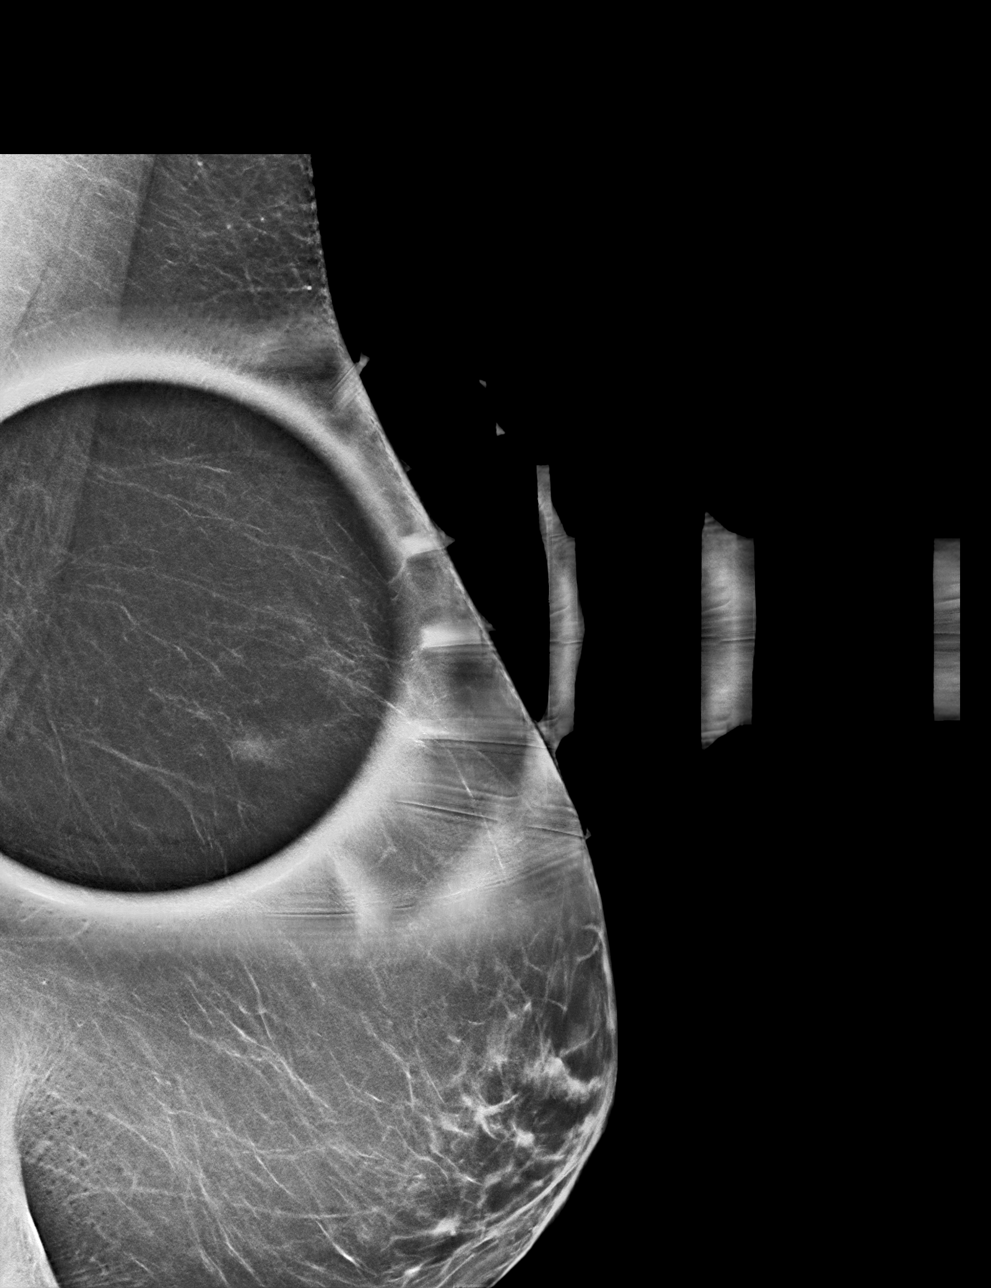

[L CC synth-2D]
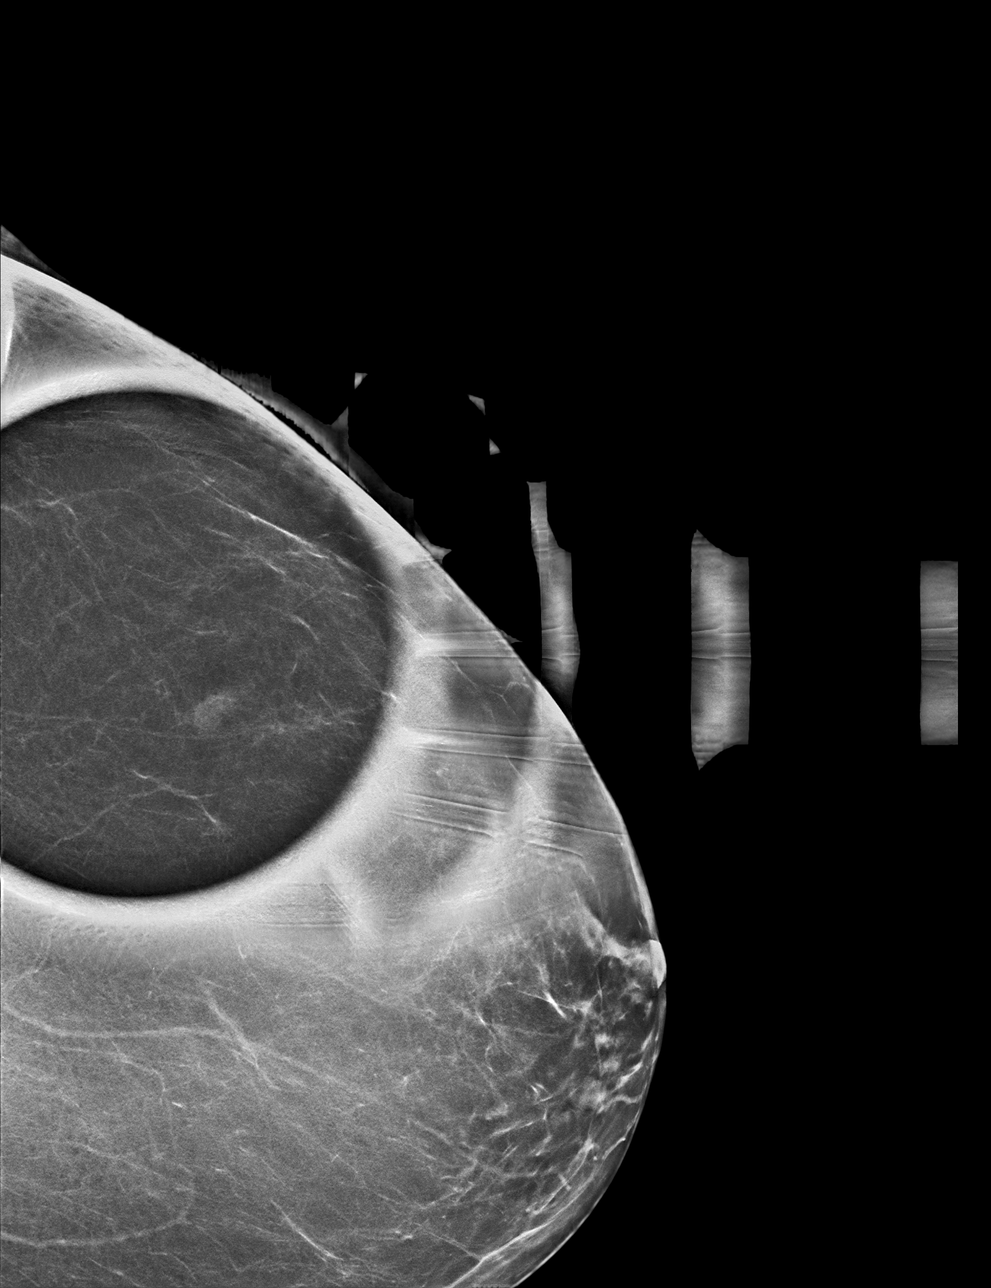

[L CC tomo · tomo slice 30/59.0]
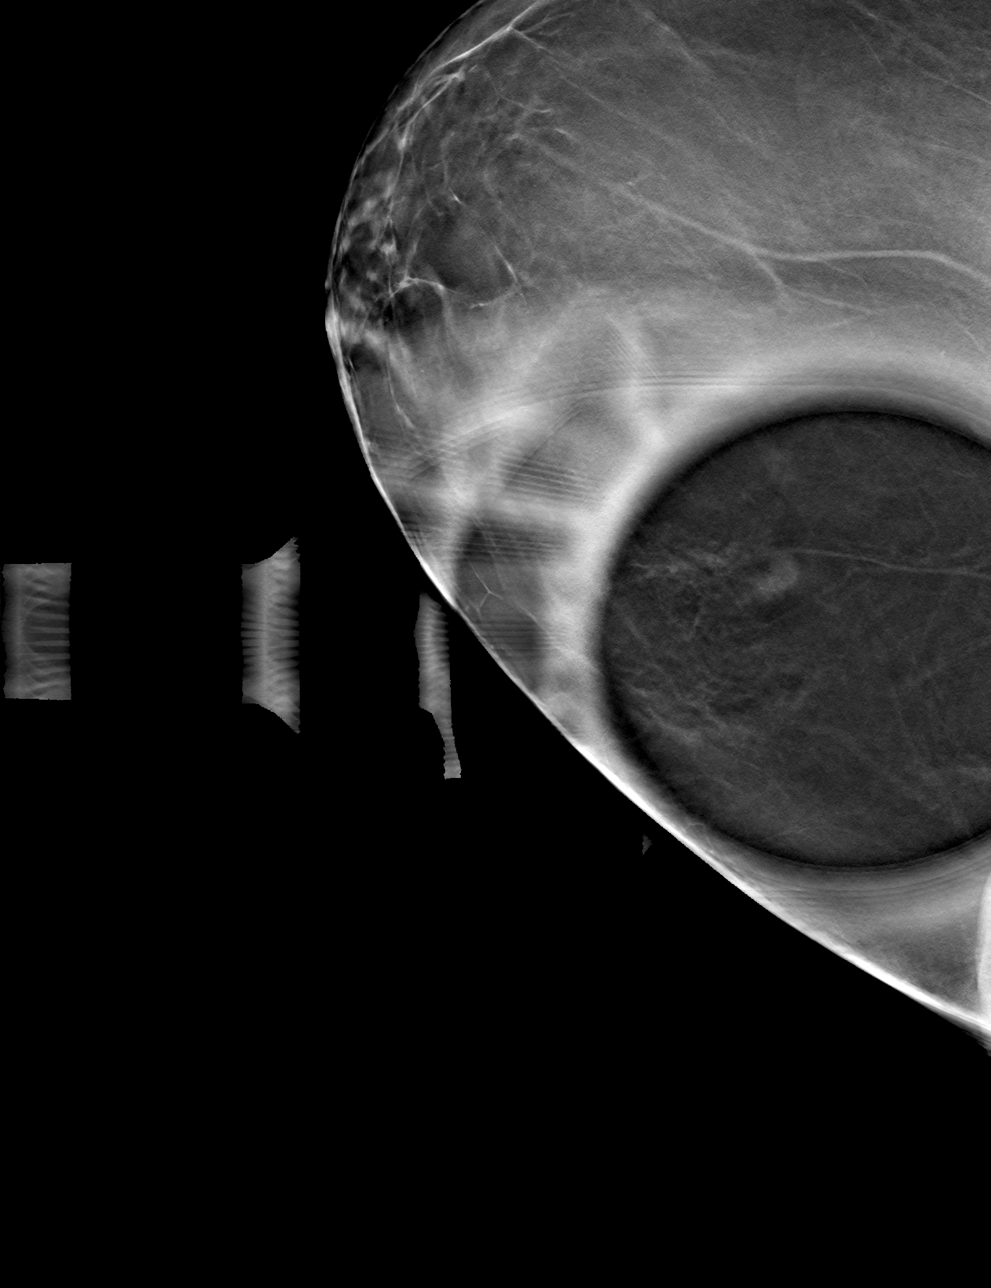

[L MLO tomo · tomo slice 33/64.0]
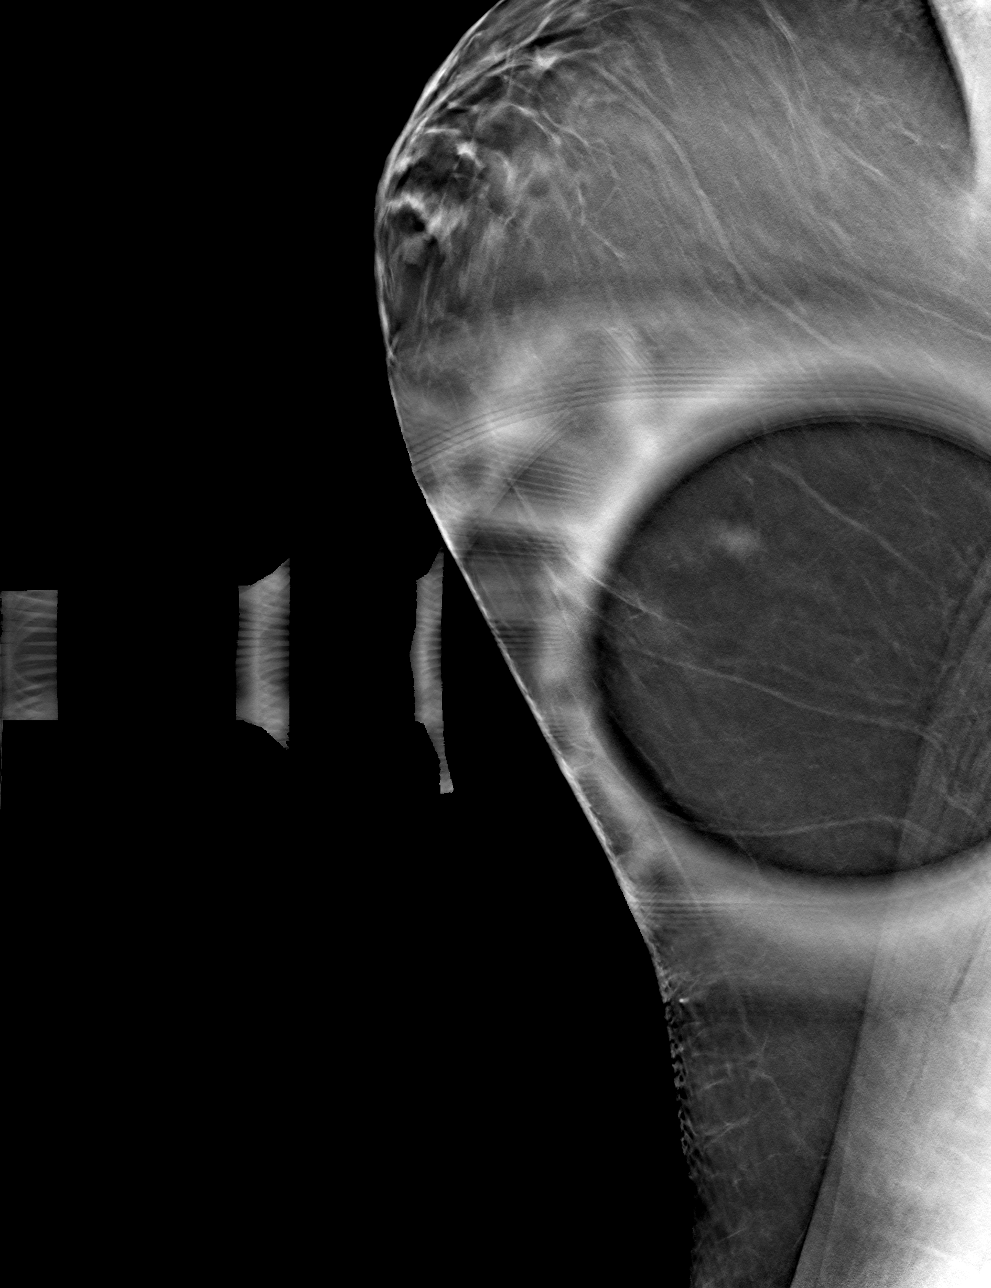

[4 of 12 positions shown; findings below may reference images not displayed]

ACR Breast Density Category b: There are scattered areas of
fibroglandular density.
FINDINGS: Focal asymmetry in the upper outer left breast persists on
additional imaging.

On physical exam, no suspicious lumps are identified.

Targeted ultrasound is performed, showing no sonographic correlate
for the left breast focal asymmetry.
IMPRESSION: Indeterminate left breast focal asymmetry.

RECOMMENDATION:
Stereotactic biopsy of the left breast focal asymmetry.

I have discussed the findings and recommendations with the patient.
If applicable, a reminder letter will be sent to the patient
regarding the next appointment.

BI-RADS CATEGORY  4: Suspicious.

## 2023-03-21 ENCOUNTER — Encounter: Payer: Self-pay | Admitting: Oncology

## 2023-03-21 ENCOUNTER — Encounter: Payer: Self-pay | Admitting: Family Medicine

## 2023-04-04 ENCOUNTER — Inpatient Hospital Stay

## 2023-04-04 ENCOUNTER — Inpatient Hospital Stay: Attending: Internal Medicine | Admitting: Internal Medicine

## 2023-04-04 ENCOUNTER — Encounter: Payer: Self-pay | Admitting: Internal Medicine

## 2023-04-04 DIAGNOSIS — Z7982 Long term (current) use of aspirin: Secondary | ICD-10-CM | POA: Insufficient documentation

## 2023-04-04 DIAGNOSIS — D539 Nutritional anemia, unspecified: Secondary | ICD-10-CM

## 2023-04-04 DIAGNOSIS — D510 Vitamin B12 deficiency anemia due to intrinsic factor deficiency: Secondary | ICD-10-CM | POA: Insufficient documentation

## 2023-04-04 DIAGNOSIS — G629 Polyneuropathy, unspecified: Secondary | ICD-10-CM | POA: Insufficient documentation

## 2023-04-04 DIAGNOSIS — E039 Hypothyroidism, unspecified: Secondary | ICD-10-CM | POA: Diagnosis not present

## 2023-04-04 DIAGNOSIS — Z79899 Other long term (current) drug therapy: Secondary | ICD-10-CM | POA: Insufficient documentation

## 2023-04-04 DIAGNOSIS — Z87891 Personal history of nicotine dependence: Secondary | ICD-10-CM | POA: Diagnosis not present

## 2023-04-04 MED ORDER — CYANOCOBALAMIN 1000 MCG/ML IJ SOLN
1000.0000 ug | INTRAMUSCULAR | Status: DC
  Administered 2023-04-04: 1000 ug via INTRAMUSCULAR

## 2023-04-04 NOTE — Progress Notes (Signed)
 [facility] CONSULT NOTE  Patient Care Team: Leanna Sato, MD as PCP - General (Family Medicine) Earna Coder, MD as Consulting Physician (Oncology)  CHIEF COMPLAINTS/PURPOSE OF CONSULTATION: B12 deficiency  Oncology History   No history exists.   HISTORY OF PRESENTING ILLNESS: Patient ambulating-independently. Accompanied by sister.   April Middleton 73 y.o.  female with a history of pernicious anemia is here for evaluation and recommendations.  Patient previously diagnosed pernicious anemia in 2020 [Dr.Rao]. Pt has had anemia on and off for years.  However patient was lost to follow-up after initial B12 injections.  Complains of worsening fatigue. 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 Patient complains of neuropathy- intermittent.   Denies any dizziness.  Has chronic back pain, arthritis. Appetite is lower. No visible blood anywhere.   Review of Systems  Constitutional:  Positive for malaise/fatigue. Negative for chills, diaphoresis, fever and weight loss.  HENT:  Negative for nosebleeds and sore throat.   Eyes:  Negative for double vision.  Respiratory:  Negative for cough, hemoptysis, sputum production, shortness of breath and wheezing.   Cardiovascular:  Negative for chest pain, palpitations, orthopnea and leg swelling.  Gastrointestinal:  Negative for abdominal pain, blood in stool, constipation, diarrhea, heartburn, melena, nausea and vomiting.  Genitourinary:  Negative for dysuria, frequency and urgency.  Musculoskeletal:  Negative for back pain and joint pain.  Skin: Negative.  Negative for itching and rash.  Neurological:  Negative for dizziness, tingling, focal weakness, weakness and headaches.  Endo/Heme/Allergies:  Does not bruise/bleed easily.  Psychiatric/Behavioral:  Negative for depression. The patient is not nervous/anxious and does not have insomnia.     MEDICAL HISTORY:  Past Medical History:   Diagnosis Date   Hypertension    Hypothyroidism    Macrocytic anemia     SURGICAL HISTORY: Past Surgical History:  Procedure Laterality Date   BREAST BIOPSY Left    BREAST BIOPSY Left 06/19/2019   Affirm Bx- X-clip neg   COLONOSCOPY WITH PROPOFOL N/A 06/28/2018   Procedure: COLONOSCOPY WITH PROPOFOL;  Surgeon: Pasty Spillers, MD;  Location: [facility] ENDOSCOPY;  Service: Endoscopy;  Laterality: N/A;   ESOPHAGOGASTRODUODENOSCOPY (EGD) WITH PROPOFOL N/A 06/28/2018   Procedure: ESOPHAGOGASTRODUODENOSCOPY (EGD) WITH PROPOFOL;  Surgeon: Pasty Spillers, MD;  Location: [facility] ENDOSCOPY;  Service: Endoscopy;  Laterality: N/A;   THYROIDECTOMY     TUBAL LIGATION      SOCIAL HISTORY: Social History   Socioeconomic History   Marital status: Married    Spouse name: Not on file   Number of children: Not on file   Years of education: Not on file   Highest education level: Not on file  Occupational History   Not on file  Tobacco Use   Smoking status: Former    Current packs/day: 0.00    Average packs/day: 1 pack/day for 20.0 years (20.0 ttl pk-yrs)    Types: Cigarettes    Start date: 07/15/1993    Quit date: 07/15/2013    Years since quitting: 9.7   Smokeless tobacco: Never  Vaping Use   Vaping status: Never Used  Substance and Sexual Activity   Alcohol use: Yes    Comment: rare alcohol 1-2 times year   Drug use: Never   Sexual activity: Not on file  Other Topics Concern   Not on file  Social History Narrative   Not on file   Social Drivers of Health   Financial Resource Strain: Not on file  Food Insecurity: No Food Insecurity (04/04/2023)   Hunger Vital Sign    Worried About Running Out of Food in the Last Year: Never true    Ran Out of Food in the Last Year: Never true  Transportation Needs: No Transportation Needs (04/04/2023)   PRAPARE - Administrator, Civil Service (Medical): No    Lack of Transportation (Non-Medical): No  Physical Activity: Not on  file  Stress: Not on file  Social Connections: Not on file  Intimate Partner Violence: Not At Risk (04/04/2023)   Humiliation, Afraid, Rape, and Kick questionnaire    Fear of Current or Ex-Partner: No    Emotionally Abused: No    Physically Abused: No    Sexually Abused: No    FAMILY HISTORY: Family History  Problem Relation Age of Onset   Cerebrovascular Accident Mother    Cerebrovascular Accident Father    Diabetes Sister     ALLERGIES:  has no known allergies.  MEDICATIONS:  Current Outpatient Medications  Medication Sig Dispense Refill   amitriptyline (ELAVIL) 25 MG tablet 1 BY MOUTH NIGHTLY AT BEDTIME MAY INCREASE BY 25 MG IN ONE WEEK     aspirin EC 81 MG tablet Take 81 mg by mouth daily.     atorvastatin (LIPITOR) 40 MG tablet Take 40 mg by mouth daily.     butalbital-acetaminophen-caffeine (FIORICET) 50-325-40 MG tablet TAKE 1 TO 2 TABLETS BY MOUTH EVERY 4 HOURS MAX 6 TABS DAILY     cetirizine (ZYRTEC) 10 MG tablet Take by mouth.     hydrochlorothiazide (HYDRODIURIL) 25 MG tablet Take 25 mg by mouth daily. for high blood pressure     levothyroxine (SYNTHROID) 100 MCG tablet Take 100 mcg by mouth daily before breakfast.     metFORMIN (GLUCOPHAGE) 500 MG tablet Take by mouth.     metoprolol succinate (TOPROL-XL) 25 MG 24 hr tablet Take 25 mg by mouth daily. for high blood pressure     tiZANidine (ZANAFLEX) 2 MG tablet Take 2 mg by mouth every 6 (six) hours as needed for muscle spasms.     cyclobenzaprine (FLEXERIL) 5 MG tablet 1 TABLET BY MOUTH TWICE DAILY AS NEEDED FOR PAIN SEPERATE BY 8 HOURS MONITOR FOR SEDATION (Patient not taking: Reported on 04/04/2023)     fluticasone (FLONASE) 50 MCG/ACT nasal spray Place 1 spray into both nostrils daily as needed.  (Patient not taking: Reported on 04/04/2023)     No current facility-administered medications for this visit.    PHYSICAL EXAMINATION:  Vitals:   04/04/23 1112  BP: (!) 145/73  Pulse: 95  Resp: 18  Temp: (!) 97 F  (36.1 C)  SpO2: 96%   Filed Weights   04/04/23 1112  Weight: 140 lb (63.5 kg)    Physical Exam Vitals and nursing note reviewed.  HENT:     Head: Normocephalic and atraumatic.     Mouth/Throat:     Pharynx: Oropharynx is clear.  Eyes:     Extraocular Movements: Extraocular movements intact.     Pupils: Pupils are equal, round, and reactive to light.  Cardiovascular:     Rate and Rhythm: Normal rate and regular rhythm.  Pulmonary:     Comments: Decreased breath sounds bilaterally.  Abdominal:     Palpations: Abdomen is soft.  Musculoskeletal:        General: Normal range of motion.     Cervical back: Normal range of motion.  Skin:    General: Skin is warm.  Neurological:  General: No focal deficit present.     Mental Status: She is alert and oriented to person, place, and time.  Psychiatric:        Behavior: Behavior normal.        Judgment: Judgment normal.     LABORATORY DATA:  I have reviewed the data as listed Lab Results  Component Value Date   WBC 13.0 (H) 07/16/2018   HGB 12.4 07/16/2018   HCT 37.6 07/16/2018   MCV 94.9 07/16/2018   PLT 517 (H) 07/16/2018   No results for input(s): "NA", "K", "CL", "CO2", "GLUCOSE", "BUN", "CREATININE", "CALCIUM", "GFRNONAA", "GFRAA", "PROT", "ALBUMIN", "AST", "ALT", "ALKPHOS", "BILITOT", "BILIDIR", "IBILI" in the last 8760 hours.  RADIOGRAPHIC STUDIES: I have personally reviewed the radiological images as listed and agreed with the findings in the report. No results found.   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  However lost to follow-up  # March 2025 hemoglobin 12-B12 202-symptomatic with extreme fatigue; tingling and numbness in activities.  Recommend starting B12 injections on a monthly basis.  Will escalate to weekly if patient not improving symptomatically.   Recommend EGD down the line.  # Chronically slightly elevated-neutrophilia thrombocytosis-likely reactive unlikely any malignant process.  Monitor follow-up.  Again repeat labs in 3 months.  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  Thank you Dr.Linda Marvis Moeller for allowing me to participate in the care of your pleasant patient. Please do not hesitate to contact me with questions or concerns in the interim.  Above plan of care was discussed with patient and her Sister Bonita Quin in detail.  Discussed with the patient that she will need lifelong B12 supplementation.  They are in agreement.  # DISPOSITION: # B12 shot today # B12 monthlyx 3 # follow up in 3 months- MD- labs- cbc/cmp; LDH; B12 level- retic count- b12 injection- Dr.B  # 45 minutes face-to-face with the patient discussing the above plan of care; more than 50% of time spent on prognosis/ natural history; counseling and coordination.   Above plan of care was discussed with patient/family in detail.  My contact information was given to the patient/family.     Earna Coder, MD 04/04/2023 8:55 PM

## 2023-04-04 NOTE — Progress Notes (Signed)
 Pt has had anemia on and off for years. Denies any dizziness. Does have fatigue. 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 Has chronic back pain, arthritis. Appetite is lower. No visible blood anywhere.

## 2023-04-04 NOTE — Assessment & Plan Note (Addendum)
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  However lost to follow-up  # March 2025 hemoglobin 12-B12 202-symptomatic with extreme fatigue; tingling and numbness in activities.  Recommend starting B12 injections on a monthly basis.  Will escalate to weekly if patient not improving symptomatically.  Recommend EGD down the line.  # Chronically slightly elevated-neutrophilia thrombocytosis-likely reactive unlikely any malignant process.  Monitor follow-up.  Again repeat labs in 3 months.  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  Thank you Dr.Linda Marvis Moeller for allowing me to participate in the care of your pleasant patient. Please do not hesitate to contact me with questions or concerns in the interim.  Above plan of care was discussed with patient and her Sister Bonita Quin in detail.  Discussed with the patient that she will need lifelong B12 supplementation.  They are in agreement.  # DISPOSITION: # B12 shot today # B12 monthlyx 3 # follow up in 3 months- MD- labs- cbc/cmp; LDH; B12 level- retic count- b12 injection- Dr.B  # 45 minutes face-to-face with the patient discussing the above plan of care; more than 50% of time spent on prognosis/ natural history; counseling and coordination.

## 2023-04-11 ENCOUNTER — Encounter: Payer: Self-pay | Admitting: Internal Medicine

## 2023-05-04 ENCOUNTER — Inpatient Hospital Stay: Attending: Internal Medicine

## 2023-05-04 DIAGNOSIS — D510 Vitamin B12 deficiency anemia due to intrinsic factor deficiency: Secondary | ICD-10-CM | POA: Insufficient documentation

## 2023-05-04 DIAGNOSIS — D539 Nutritional anemia, unspecified: Secondary | ICD-10-CM

## 2023-05-04 MED ORDER — CYANOCOBALAMIN 1000 MCG/ML IJ SOLN
1000.0000 ug | INTRAMUSCULAR | Status: DC
  Administered 2023-05-04: 1000 ug via INTRAMUSCULAR
  Filled 2023-05-04: qty 1

## 2023-05-05 ENCOUNTER — Inpatient Hospital Stay

## 2023-06-06 ENCOUNTER — Inpatient Hospital Stay: Attending: Internal Medicine

## 2023-06-06 DIAGNOSIS — D539 Nutritional anemia, unspecified: Secondary | ICD-10-CM

## 2023-06-06 DIAGNOSIS — D510 Vitamin B12 deficiency anemia due to intrinsic factor deficiency: Secondary | ICD-10-CM | POA: Diagnosis present

## 2023-06-06 MED ORDER — CYANOCOBALAMIN 1000 MCG/ML IJ SOLN
1000.0000 ug | INTRAMUSCULAR | Status: DC
  Administered 2023-06-06: 1000 ug via INTRAMUSCULAR
  Filled 2023-06-06: qty 1

## 2023-07-04 ENCOUNTER — Inpatient Hospital Stay: Admitting: Internal Medicine

## 2023-07-04 ENCOUNTER — Inpatient Hospital Stay

## 2023-07-11 ENCOUNTER — Inpatient Hospital Stay (HOSPITAL_BASED_OUTPATIENT_CLINIC_OR_DEPARTMENT_OTHER): Admitting: Internal Medicine

## 2023-07-11 ENCOUNTER — Inpatient Hospital Stay

## 2023-07-11 ENCOUNTER — Encounter: Payer: Self-pay | Admitting: Internal Medicine

## 2023-07-11 ENCOUNTER — Inpatient Hospital Stay: Attending: Internal Medicine

## 2023-07-11 ENCOUNTER — Other Ambulatory Visit: Payer: Self-pay

## 2023-07-11 VITALS — BP 138/82 | HR 76 | Temp 99.1°F | Resp 16 | Ht 63.0 in | Wt 141.0 lb

## 2023-07-11 DIAGNOSIS — D510 Vitamin B12 deficiency anemia due to intrinsic factor deficiency: Secondary | ICD-10-CM

## 2023-07-11 DIAGNOSIS — Z87891 Personal history of nicotine dependence: Secondary | ICD-10-CM | POA: Insufficient documentation

## 2023-07-11 DIAGNOSIS — Z79899 Other long term (current) drug therapy: Secondary | ICD-10-CM | POA: Diagnosis not present

## 2023-07-11 DIAGNOSIS — E039 Hypothyroidism, unspecified: Secondary | ICD-10-CM | POA: Insufficient documentation

## 2023-07-11 DIAGNOSIS — D539 Nutritional anemia, unspecified: Secondary | ICD-10-CM

## 2023-07-11 LAB — CMP (CANCER CENTER ONLY)
ALT: 17 U/L (ref 0–44)
AST: 18 U/L (ref 15–41)
Albumin: 4.1 g/dL (ref 3.5–5.0)
Alkaline Phosphatase: 86 U/L (ref 38–126)
Anion gap: 10 (ref 5–15)
BUN: 15 mg/dL (ref 8–23)
CO2: 28 mmol/L (ref 22–32)
Calcium: 9.2 mg/dL (ref 8.9–10.3)
Chloride: 97 mmol/L — ABNORMAL LOW (ref 98–111)
Creatinine: 0.57 mg/dL (ref 0.44–1.00)
GFR, Estimated: >60 mL/min (ref >60)
Glucose, Bld: 110 mg/dL — ABNORMAL HIGH (ref 70–99)
Potassium: 3.2 mmol/L — ABNORMAL LOW (ref 3.5–5.1)
Sodium: 135 mmol/L (ref 135–145)
Total Bilirubin: 0.4 mg/dL (ref 0.0–1.2)
Total Protein: 8 g/dL (ref 6.5–8.1)

## 2023-07-11 LAB — CBC WITH DIFFERENTIAL (CANCER CENTER ONLY)
Abs Immature Granulocytes: 0.03 10*3/uL (ref 0.00–0.07)
Basophils Absolute: 0.1 10*3/uL (ref 0.0–0.1)
Basophils Relative: 1 %
Eosinophils Absolute: 0.9 10*3/uL — ABNORMAL HIGH (ref 0.0–0.5)
Eosinophils Relative: 10 %
HCT: 36.4 % (ref 36.0–46.0)
Hemoglobin: 11.8 g/dL — ABNORMAL LOW (ref 12.0–15.0)
Immature Granulocytes: 0 %
Lymphocytes Relative: 22 %
Lymphs Abs: 2.1 10*3/uL (ref 0.7–4.0)
MCH: 27.7 pg (ref 26.0–34.0)
MCHC: 32.4 g/dL (ref 30.0–36.0)
MCV: 85.4 fL (ref 80.0–100.0)
Monocytes Absolute: 0.6 10*3/uL (ref 0.1–1.0)
Monocytes Relative: 6 %
Neutro Abs: 5.9 10*3/uL (ref 1.7–7.7)
Neutrophils Relative %: 61 %
Platelet Count: 508 10*3/uL — ABNORMAL HIGH (ref 150–400)
RBC: 4.26 MIL/uL (ref 3.87–5.11)
RDW: 14.1 % (ref 11.5–15.5)
WBC Count: 9.6 10*3/uL (ref 4.0–10.5)
nRBC: 0 % (ref 0.0–0.2)

## 2023-07-11 LAB — FERRITIN: Ferritin: 5 ng/mL — ABNORMAL LOW (ref 11–307)

## 2023-07-11 LAB — RETICULOCYTES
Immature Retic Fract: 8 % (ref 2.3–15.9)
RBC.: 4.18 MIL/uL (ref 3.87–5.11)
Retic Count, Absolute: 57.7 10*3/uL (ref 19.0–186.0)
Retic Ct Pct: 1.4 % (ref 0.4–3.1)

## 2023-07-11 LAB — VITAMIN B12: Vitamin B-12: 196 pg/mL (ref 180–914)

## 2023-07-11 LAB — LACTATE DEHYDROGENASE: LDH: 138 U/L (ref 98–192)

## 2023-07-11 LAB — IRON AND TIBC
Iron: 38 ug/dL (ref 28–170)
Saturation Ratios: 8 % — ABNORMAL LOW (ref 10.4–31.8)
TIBC: 490 ug/dL — ABNORMAL HIGH (ref 250–450)
UIBC: 452 ug/dL

## 2023-07-11 MED ORDER — CYANOCOBALAMIN 1000 MCG/ML IJ SOLN
1000.0000 ug | INTRAMUSCULAR | Status: DC
  Administered 2023-07-11: 1000 ug via INTRAMUSCULAR
  Filled 2023-07-11: qty 1

## 2023-07-11 NOTE — Assessment & Plan Note (Addendum)
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 Discussed with the patient that she will need lifelong B12 supplementation.   # March 2025 hemoglobin 12-B12 202-[ symptomatic with extreme fatigue; tingling and numbness in activities]- proceed with b12 injection for now; and will plan- B12 sublingual 1000 mcg once a day [under the tongue; helps with better absorption].  Will Recheck b12, if not improved the continue B12 injections.  Recommend EGD down the line.   # mild anemia 11.8- Recommend gentle iron [iron biglycinate; 28 mg ] 1 pill a day.  This pill is unlikely to cause stomach upset or cause constipation.   # Chronically slightly elevated-neutrophilia thrombocytosis-likely reactive unlikely any malignant process.  Monitor follow-up.  Again repeat labs in 3 months.  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  # DISPOSITION: # add iron panel; ferritin today # B12 shot today # follow up in 3 months- MD- labs- cbc/cmp; LDH; iron syudies; ferritin- B12 level- retic count- b12 injection- Dr.B

## 2023-07-11 NOTE — Progress Notes (Signed)
 Patient here for follow up; patient denies any concerns at this time.

## 2023-07-11 NOTE — Patient Instructions (Signed)
#   Recommend B12 sublingual 1000 mcg once a day [under the tongue; helps with better absorption].    # Recommend gentle iron [iron biglycinate; 28 mg ] 1 pill a day.  This pill is unlikely to cause stomach upset or cause constipation.

## 2023-07-11 NOTE — Progress Notes (Signed)
 [facility] CONSULT NOTE  Patient Care Team: Buren Rock HERO, MD as PCP - General (Family Medicine) Rennie Cindy SAUNDERS, MD as Consulting Physician (Oncology)  CHIEF COMPLAINTS/PURPOSE OF CONSULTATION: B12 deficiency  Oncology History   No history exists.   HISTORY OF PRESENTING ILLNESS: Patient ambulating-independently. Accompanied by daughter.   April Middleton 73 y.o.  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  Patient s/p B12 injections notes to have improvement of energy levels.  Admits intermittently and numbness in the extremities  Review of Systems  Constitutional:  Positive for malaise/fatigue. Negative for chills, diaphoresis, fever and weight loss.  HENT:  Negative for nosebleeds and sore throat.   Eyes:  Negative for double vision.  Respiratory:  Negative for cough, hemoptysis, sputum production, shortness of breath and wheezing.   Cardiovascular:  Negative for chest pain, palpitations, orthopnea and leg swelling.  Gastrointestinal:  Negative for abdominal pain, blood in stool, constipation, diarrhea, heartburn, melena, nausea and vomiting.  Genitourinary:  Negative for dysuria, frequency and urgency.  Musculoskeletal:  Negative for back pain and joint pain.  Skin: Negative.  Negative for itching and rash.  Neurological:  Negative for dizziness, tingling, focal weakness, weakness and headaches.  Endo/Heme/Allergies:  Does not bruise/bleed easily.  Psychiatric/Behavioral:  Negative for depression. The patient is not nervous/anxious and does not have insomnia.     MEDICAL HISTORY:  Past Medical History:  Diagnosis Date   Hypertension    Hypothyroidism    Macrocytic anemia     SURGICAL HISTORY: Past Surgical History:  Procedure Laterality Date   BREAST BIOPSY Left    BREAST BIOPSY Left 06/19/2019   Affirm Bx- X-clip neg   COLONOSCOPY WITH PROPOFOL  N/A 06/28/2018   Procedure: COLONOSCOPY WITH PROPOFOL ;   Surgeon: Janalyn Keene NOVAK, MD;  Location: [facility] ENDOSCOPY;  Service: Endoscopy;  Laterality: N/A;   ESOPHAGOGASTRODUODENOSCOPY (EGD) WITH PROPOFOL  N/A 06/28/2018   Procedure: ESOPHAGOGASTRODUODENOSCOPY (EGD) WITH PROPOFOL ;  Surgeon: Janalyn Keene NOVAK, MD;  Location: [facility] ENDOSCOPY;  Service: Endoscopy;  Laterality: N/A;   THYROIDECTOMY     TUBAL LIGATION      SOCIAL HISTORY: Social History   Socioeconomic History   Marital status: Married    Spouse name: Not on file   Number of children: Not on file   Years of education: Not on file   Highest education level: Not on file  Occupational History   Not on file  Tobacco Use   Smoking status: Former    Current packs/day: 0.00    Average packs/day: 1 pack/day for 20.0 years (20.0 ttl pk-yrs)    Types: Cigarettes    Start date: 07/15/1993    Quit date: 07/15/2013    Years since quitting: 9.9   Smokeless tobacco: Never  Vaping Use   Vaping status: Never Used  Substance and Sexual Activity   Alcohol use: Yes    Comment: rare alcohol 1-2 times year   Drug use: Never   Sexual activity: Not on file  Other Topics Concern   Not on file  Social History Narrative   Not on file   Social Drivers of Health   Financial Resource Strain: Not on file  Food Insecurity: No Food Insecurity (04/04/2023)   Hunger Vital Sign    Worried About Running Out of Food in the Last Year: Never true    Ran Out of Food in the Last Year: Never true  Transportation Needs: No Transportation Needs (04/04/2023)   PRAPARE -  Administrator, Civil Service (Medical): No    Lack of Transportation (Non-Medical): No  Physical Activity: Not on file  Stress: Not on file  Social Connections: Not on file  Intimate Partner Violence: Not At Risk (04/04/2023)   Humiliation, Afraid, Rape, and Kick questionnaire    Fear of Current or Ex-Partner: No    Emotionally Abused: No    Physically Abused: No    Sexually Abused: No    FAMILY HISTORY: Family History   Problem Relation Age of Onset   Cerebrovascular Accident Mother    Cerebrovascular Accident Father    Diabetes Sister     ALLERGIES:  has no known allergies.  MEDICATIONS:  Current Outpatient Medications  Medication Sig Dispense Refill   amitriptyline (ELAVIL) 25 MG tablet 1 BY MOUTH NIGHTLY AT BEDTIME MAY INCREASE BY 25 MG IN ONE WEEK     aspirin EC 81 MG tablet Take 81 mg by mouth daily.     atorvastatin (LIPITOR) 40 MG tablet Take 40 mg by mouth daily.     butalbital-acetaminophen-caffeine (FIORICET) 50-325-40 MG tablet TAKE 1 TO 2 TABLETS BY MOUTH EVERY 4 HOURS MAX 6 TABS DAILY     cetirizine (ZYRTEC) 10 MG tablet Take by mouth.     hydrochlorothiazide (HYDRODIURIL) 25 MG tablet Take 25 mg by mouth daily. for high blood pressure     levothyroxine (SYNTHROID) 100 MCG tablet Take 100 mcg by mouth daily before breakfast.     metFORMIN (GLUCOPHAGE) 500 MG tablet Take by mouth.     metoprolol succinate (TOPROL-XL) 25 MG 24 hr tablet Take 25 mg by mouth daily. for high blood pressure     tiZANidine (ZANAFLEX) 2 MG tablet Take 2 mg by mouth every 6 (six) hours as needed for muscle spasms.     cyclobenzaprine (FLEXERIL) 5 MG tablet 1 TABLET BY MOUTH TWICE DAILY AS NEEDED FOR PAIN SEPERATE BY 8 HOURS MONITOR FOR SEDATION (Patient not taking: Reported on 07/11/2023)     fluticasone (FLONASE) 50 MCG/ACT nasal spray Place 1 spray into both nostrils daily as needed.  (Patient not taking: Reported on 07/11/2023)     No current facility-administered medications for this visit.   Facility-Administered Medications Ordered in Other Visits  Medication Dose Route Frequency Provider Last Rate Last Admin   cyanocobalamin  (VITAMIN B12) injection 1,000 mcg  1,000 mcg Intramuscular Q30 days Shakhia Gramajo R, MD   1,000 mcg at 07/11/23 1444    PHYSICAL EXAMINATION:  Vitals:   07/11/23 1311  BP: 138/82  Pulse: 76  Resp: 16  Temp: 99.1 F (37.3 C)  SpO2: 96%   Filed Weights   07/11/23 1311   Weight: 141 lb (64 kg)    Physical Exam Vitals and nursing note reviewed.  HENT:     Head: Normocephalic and atraumatic.     Mouth/Throat:     Pharynx: Oropharynx is clear.   Eyes:     Extraocular Movements: Extraocular movements intact.     Pupils: Pupils are equal, round, and reactive to light.    Cardiovascular:     Rate and Rhythm: Normal rate and regular rhythm.  Pulmonary:     Comments: Decreased breath sounds bilaterally.  Abdominal:     Palpations: Abdomen is soft.   Musculoskeletal:        General: Normal range of motion.     Cervical back: Normal range of motion.   Skin:    General: Skin is warm.   Neurological:  General: No focal deficit present.     Mental Status: She is alert and oriented to person, place, and time.   Psychiatric:        Behavior: Behavior normal.        Judgment: Judgment normal.     LABORATORY DATA:  I have reviewed the data as listed Lab Results  Component Value Date   WBC 9.6 07/11/2023   HGB 11.8 (L) 07/11/2023   HCT 36.4 07/11/2023   MCV 85.4 07/11/2023   PLT 508 (H) 07/11/2023   Recent Labs    07/11/23 1321  NA 135  K 3.2*  CL 97*  CO2 28  GLUCOSE 110*  BUN 15  CREATININE 0.57  CALCIUM 9.2  GFRNONAA >60  PROT 8.0  ALBUMIN 4.1  AST 18  ALT 17  ALKPHOS 86  BILITOT 0.4    RADIOGRAPHIC STUDIES: I have personally reviewed the radiological images as listed and agreed with the findings in the report. No results found.   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 Discussed with the patient that she will need lifelong B12 supplementation.   # March 2025 hemoglobin 12-B12 202-[ symptomatic with extreme fatigue; tingling and numbness in activities]- proceed with b12 injection for now; and will plan- B12 sublingual 1000 mcg once a day [under the tongue;  helps with better absorption].  Will Recheck b12, if not improved the continue B12 injections.  Recommend EGD down the line.   # mild anemia 11.8- Recommend gentle iron [iron biglycinate; 28 mg ] 1 pill a day.  This pill is unlikely to cause stomach upset or cause constipation.   # Chronically slightly elevated-neutrophilia thrombocytosis-likely reactive unlikely any malignant process.  Monitor follow-up.  Again repeat labs in 3 months.  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  # DISPOSITION: # add iron panel; ferritin today # B12 shot today # follow up in 3 months- MD- labs- cbc/cmp; LDH; iron syudies; ferritin- B12 level- retic count- b12 injection- Dr.B   Above plan of care was discussed with patient/family in detail.  My contact information was given to the patient/family.     Cindy JONELLE Joe, MD 07/11/2023 3:13 PM

## 2023-10-17 ENCOUNTER — Inpatient Hospital Stay: Attending: Internal Medicine

## 2023-10-17 ENCOUNTER — Inpatient Hospital Stay: Admitting: Internal Medicine

## 2023-10-17 ENCOUNTER — Inpatient Hospital Stay

## 2023-10-17 ENCOUNTER — Encounter: Payer: Self-pay | Admitting: Internal Medicine

## 2023-10-17 VITALS — BP 133/77 | HR 98 | Temp 97.8°F | Resp 16 | Ht 63.0 in | Wt 136.5 lb

## 2023-10-17 DIAGNOSIS — D510 Vitamin B12 deficiency anemia due to intrinsic factor deficiency: Secondary | ICD-10-CM | POA: Insufficient documentation

## 2023-10-17 DIAGNOSIS — E039 Hypothyroidism, unspecified: Secondary | ICD-10-CM | POA: Insufficient documentation

## 2023-10-17 DIAGNOSIS — Z79899 Other long term (current) drug therapy: Secondary | ICD-10-CM | POA: Diagnosis not present

## 2023-10-17 DIAGNOSIS — D539 Nutritional anemia, unspecified: Secondary | ICD-10-CM

## 2023-10-17 DIAGNOSIS — Z7982 Long term (current) use of aspirin: Secondary | ICD-10-CM | POA: Insufficient documentation

## 2023-10-17 DIAGNOSIS — Z87891 Personal history of nicotine dependence: Secondary | ICD-10-CM | POA: Diagnosis not present

## 2023-10-17 DIAGNOSIS — I10 Essential (primary) hypertension: Secondary | ICD-10-CM | POA: Diagnosis not present

## 2023-10-17 LAB — RETIC PANEL
Immature Retic Fract: 3.8 % (ref 2.3–15.9)
RBC.: 4.19 MIL/uL (ref 3.87–5.11)
Retic Count, Absolute: 44.8 K/uL (ref 19.0–186.0)
Retic Ct Pct: 1.1 % (ref 0.4–3.1)
Reticulocyte Hemoglobin: 32.8 pg (ref >27.9)

## 2023-10-17 LAB — IRON AND TIBC
Iron: 63 ug/dL (ref 28–170)
Saturation Ratios: 17 % (ref 10.4–31.8)
TIBC: 377 ug/dL (ref 250–450)
UIBC: 314 ug/dL

## 2023-10-17 LAB — CBC WITH DIFFERENTIAL (CANCER CENTER ONLY)
Abs Immature Granulocytes: 0.03 K/uL (ref 0.00–0.07)
Basophils Absolute: 0 K/uL (ref 0.0–0.1)
Basophils Relative: 0 %
Eosinophils Absolute: 0.9 K/uL — ABNORMAL HIGH (ref 0.0–0.5)
Eosinophils Relative: 10 %
HCT: 36.7 % (ref 36.0–46.0)
Hemoglobin: 12 g/dL (ref 12.0–15.0)
Immature Granulocytes: 0 %
Lymphocytes Relative: 23 %
Lymphs Abs: 2 K/uL (ref 0.7–4.0)
MCH: 28.7 pg (ref 26.0–34.0)
MCHC: 32.7 g/dL (ref 30.0–36.0)
MCV: 87.8 fL (ref 80.0–100.0)
Monocytes Absolute: 0.4 K/uL (ref 0.1–1.0)
Monocytes Relative: 5 %
Neutro Abs: 5.4 K/uL (ref 1.7–7.7)
Neutrophils Relative %: 62 %
Platelet Count: 363 K/uL (ref 150–400)
RBC: 4.18 MIL/uL (ref 3.87–5.11)
RDW: 13.4 % (ref 11.5–15.5)
WBC Count: 8.7 K/uL (ref 4.0–10.5)
nRBC: 0 % (ref 0.0–0.2)

## 2023-10-17 LAB — CMP (CANCER CENTER ONLY)
ALT: 13 U/L (ref 0–44)
AST: 20 U/L (ref 15–41)
Albumin: 4.1 g/dL (ref 3.5–5.0)
Alkaline Phosphatase: 73 U/L (ref 38–126)
Anion gap: 14 (ref 5–15)
BUN: 22 mg/dL (ref 8–23)
CO2: 25 mmol/L (ref 22–32)
Calcium: 8.4 mg/dL — ABNORMAL LOW (ref 8.9–10.3)
Chloride: 97 mmol/L — ABNORMAL LOW (ref 98–111)
Creatinine: 0.75 mg/dL (ref 0.44–1.00)
GFR, Estimated: >60 mL/min (ref >60)
Glucose, Bld: 228 mg/dL — ABNORMAL HIGH (ref 70–99)
Potassium: 2.7 mmol/L — CL (ref 3.5–5.1)
Sodium: 136 mmol/L (ref 135–145)
Total Bilirubin: 0.5 mg/dL (ref 0.0–1.2)
Total Protein: 7.3 g/dL (ref 6.5–8.1)

## 2023-10-17 LAB — FERRITIN: Ferritin: 16 ng/mL (ref 11–307)

## 2023-10-17 LAB — VITAMIN B12: Vitamin B-12: 886 pg/mL (ref 180–914)

## 2023-10-17 LAB — LACTATE DEHYDROGENASE: LDH: 126 U/L (ref 98–192)

## 2023-10-17 MED ORDER — POTASSIUM CHLORIDE CRYS ER 20 MEQ PO TBCR: EXTENDED_RELEASE_TABLET | ORAL | 0 refills | Status: AC

## 2023-10-17 MED ORDER — CYANOCOBALAMIN 1000 MCG/ML IJ SOLN
1000.0000 ug | INTRAMUSCULAR | Status: DC
  Administered 2023-10-17: 1000 ug via INTRAMUSCULAR
  Filled 2023-10-17: qty 1

## 2023-10-17 NOTE — Progress Notes (Signed)
 [facility] CONSULT NOTE  Patient Care Team: Buren Rock HERO, MD as PCP - General (Family Medicine) Rennie Cindy SAUNDERS, MD as Consulting Physician (Oncology)  CHIEF COMPLAINTS/PURPOSE OF CONSULTATION: B12 deficiency  Oncology History   No history exists.   HISTORY OF PRESENTING ILLNESS: Patient ambulating-independently. Accompanied by daughter.   April Middleton 73 y.o.  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  Patient complains of ongoing fatigue. She has been compliant with sublingual B12.   Patient s/p B12 injections notes to have improvement of energy levels.  Admits intermittently and numbness in the extremities  Review of Systems  Constitutional:  Positive for malaise/fatigue. Negative for chills, diaphoresis, fever and weight loss.  HENT:  Negative for nosebleeds and sore throat.   Eyes:  Negative for double vision.  Respiratory:  Negative for cough, hemoptysis, sputum production, shortness of breath and wheezing.   Cardiovascular:  Negative for chest pain, palpitations, orthopnea and leg swelling.  Gastrointestinal:  Negative for abdominal pain, blood in stool, constipation, diarrhea, heartburn, melena, nausea and vomiting.  Genitourinary:  Negative for dysuria, frequency and urgency.  Musculoskeletal:  Negative for back pain and joint pain.  Skin: Negative.  Negative for itching and rash.  Neurological:  Negative for dizziness, tingling, focal weakness, weakness and headaches.  Endo/Heme/Allergies:  Does not bruise/bleed easily.  Psychiatric/Behavioral:  Negative for depression. The patient is not nervous/anxious and does not have insomnia.     MEDICAL HISTORY:  Past Medical History:  Diagnosis Date   Hypertension    Hypothyroidism    Macrocytic anemia     SURGICAL HISTORY: Past Surgical History:  Procedure Laterality Date   BREAST BIOPSY Left    BREAST BIOPSY Left 06/19/2019   Affirm Bx- X-clip neg    COLONOSCOPY WITH PROPOFOL  N/A 06/28/2018   Procedure: COLONOSCOPY WITH PROPOFOL ;  Surgeon: Janalyn Keene NOVAK, MD;  Location: [facility] ENDOSCOPY;  Service: Endoscopy;  Laterality: N/A;   ESOPHAGOGASTRODUODENOSCOPY (EGD) WITH PROPOFOL  N/A 06/28/2018   Procedure: ESOPHAGOGASTRODUODENOSCOPY (EGD) WITH PROPOFOL ;  Surgeon: Janalyn Keene NOVAK, MD;  Location: [facility] ENDOSCOPY;  Service: Endoscopy;  Laterality: N/A;   THYROIDECTOMY     TUBAL LIGATION      SOCIAL HISTORY: Social History   Socioeconomic History   Marital status: Married    Spouse name: Not on file   Number of children: Not on file   Years of education: Not on file   Highest education level: Not on file  Occupational History   Not on file  Tobacco Use   Smoking status: Former    Current packs/day: 0.00    Average packs/day: 1 pack/day for 20.0 years (20.0 ttl pk-yrs)    Types: Cigarettes    Start date: 07/15/1993    Quit date: 07/15/2013    Years since quitting: 10.2   Smokeless tobacco: Never  Vaping Use   Vaping status: Never Used  Substance and Sexual Activity   Alcohol use: Yes    Comment: rare alcohol 1-2 times year   Drug use: Never   Sexual activity: Not on file  Other Topics Concern   Not on file  Social History Narrative   Not on file   Social Drivers of Health   Financial Resource Strain: Not on file  Food Insecurity: No Food Insecurity (04/04/2023)   Hunger Vital Sign    Worried About Running Out of Food in the Last Year: Never true    Ran Out of Food in the Last  Year: Never true  Transportation Needs: No Transportation Needs (04/04/2023)   PRAPARE - Administrator, Civil Service (Medical): No    Lack of Transportation (Non-Medical): No  Physical Activity: Not on file  Stress: Not on file  Social Connections: Not on file  Intimate Partner Violence: Not At Risk (04/04/2023)   Humiliation, Afraid, Rape, and Kick questionnaire    Fear of Current or Ex-Partner: No    Emotionally Abused: No     Physically Abused: No    Sexually Abused: No    FAMILY HISTORY: Family History  Problem Relation Age of Onset   Cerebrovascular Accident Mother    Cerebrovascular Accident Father    Diabetes Sister     ALLERGIES:  has no known allergies.  MEDICATIONS:  Current Outpatient Medications  Medication Sig Dispense Refill   amitriptyline (ELAVIL) 25 MG tablet 1 BY MOUTH NIGHTLY AT BEDTIME MAY INCREASE BY 25 MG IN ONE WEEK     aspirin EC 81 MG tablet Take 81 mg by mouth daily.     atorvastatin (LIPITOR) 40 MG tablet Take 40 mg by mouth daily.     butalbital-acetaminophen-caffeine (FIORICET) 50-325-40 MG tablet TAKE 1 TO 2 TABLETS BY MOUTH EVERY 4 HOURS MAX 6 TABS DAILY     fluticasone (FLONASE) 50 MCG/ACT nasal spray Place 1 spray into both nostrils daily as needed.      hydrochlorothiazide (HYDRODIURIL) 25 MG tablet Take 25 mg by mouth daily. for high blood pressure     levothyroxine (SYNTHROID) 100 MCG tablet Take 100 mcg by mouth daily before breakfast.     metFORMIN (GLUCOPHAGE) 500 MG tablet Take 500 mg by mouth daily with breakfast.     metoprolol succinate (TOPROL-XL) 25 MG 24 hr tablet Take 25 mg by mouth daily. for high blood pressure     potassium chloride SA (KLOR-CON M) 20 MEQ tablet 1 pill twice a day 30 tablet 0   tiZANidine (ZANAFLEX) 2 MG tablet Take 2 mg by mouth every 6 (six) hours as needed for muscle spasms.     No current facility-administered medications for this visit.   Facility-Administered Medications Ordered in Other Visits  Medication Dose Route Frequency Provider Last Rate Last Admin   cyanocobalamin  (VITAMIN B12) injection 1,000 mcg  1,000 mcg Intramuscular Q30 days Pal Shell R, MD   1,000 mcg at 10/17/23 1110    PHYSICAL EXAMINATION:  Vitals:   10/17/23 1004  BP: 133/77  Pulse: 98  Resp: 16  Temp: 97.8 F (36.6 C)  SpO2: 97%   Filed Weights   10/17/23 1004  Weight: 136 lb 8 oz (61.9 kg)    Physical Exam Vitals and nursing note  reviewed.  HENT:     Head: Normocephalic and atraumatic.     Mouth/Throat:     Pharynx: Oropharynx is clear.  Eyes:     Extraocular Movements: Extraocular movements intact.     Pupils: Pupils are equal, round, and reactive to light.  Cardiovascular:     Rate and Rhythm: Normal rate and regular rhythm.  Pulmonary:     Comments: Decreased breath sounds bilaterally.  Abdominal:     Palpations: Abdomen is soft.  Musculoskeletal:        General: Normal range of motion.     Cervical back: Normal range of motion.  Skin:    General: Skin is warm.  Neurological:     General: No focal deficit present.     Mental Status: She is alert and oriented to  person, place, and time.  Psychiatric:        Behavior: Behavior normal.        Judgment: Judgment normal.     LABORATORY DATA:  I have reviewed the data as listed Lab Results  Component Value Date   WBC 8.7 10/17/2023   HGB 12.0 10/17/2023   HCT 36.7 10/17/2023   MCV 87.8 10/17/2023   PLT 363 10/17/2023   Recent Labs    07/11/23 1321 10/17/23 1008  NA 135 136  K 3.2* 2.7*  CL 97* 97*  CO2 28 25  GLUCOSE 110* 228*  BUN 15 22  CREATININE 0.57 0.75  CALCIUM 9.2 8.4*  GFRNONAA >60 >60  PROT 8.0 7.3  ALBUMIN 4.1 4.1  AST 18 20  ALT 17 13  ALKPHOS 86 73  BILITOT 0.4 0.5    RADIOGRAPHIC STUDIES: I have personally reviewed the radiological images as listed and agreed with the findings in the report. No results found.   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 Discussed with the patient that she will need lifelong B12 supplementation.   # March 2025 hemoglobin 12-B12 202-[ symptomatic with extreme fatigue; tingling and numbness in activities]- proceed with b12 injection for now; and CONTINUE  B12 sublingual 1000 mcg once a day.   # mild anemia 12.CONTINUE gentle  iron [iron biglycinate; 28 mg ] 1 pill a day.      #  Potassium low at 2.7-likely secondary to hydrochlorothiazide. Hold hydrochlorothiazide-until further evaluation with Dr. Buren.  Continue taking metoprolol-this should not affect your potassium.Potassium supplementation-however this will need to be rechecked in the next few weeks with the PCP.  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  # DISPOSITION: # b12 injection # b12 injectio in 3 months # follow up in 6  months- MD- labs- cbc/cmp; LDH; iron syudies; ferritin- B12 level- retic count- b12 injection- Dr.B   Above plan of care was discussed with patient/family in detail.  My contact information was given to the patient/family.     Cindy JONELLE Joe, MD 10/17/2023 11:57 AM

## 2023-10-17 NOTE — Progress Notes (Signed)
 Critical lab called by Luke in lab; Potassium 2.7 . Readback. Dr B notified.

## 2023-10-17 NOTE — Assessment & Plan Note (Addendum)
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 Discussed with the patient that she will need lifelong B12 supplementation.   # March 2025 hemoglobin 12-B12 202-[ symptomatic with extreme fatigue; tingling and numbness in activities]- proceed with b12 injection for now; and CONTINUE  B12 sublingual 1000 mcg once a day.   # mild anemia 12.CONTINUE gentle iron [iron biglycinate; 28 mg ] 1 pill a day.      #  Potassium low at 2.7-likely secondary to hydrochlorothiazide. Hold hydrochlorothiazide-until further evaluation with Dr. Buren.  Continue taking metoprolol-this should not affect your potassium.Potassium supplementation-however this will need to be rechecked in the next few weeks with the PCP.  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  # DISPOSITION: # b12 injection # b12 injectio in 3 months # follow up in 6  months- MD- labs- cbc/cmp; LDH; iron syudies; ferritin- B12 level- retic count- b12 injection- Dr.B

## 2023-10-17 NOTE — Patient Instructions (Signed)
#   Potassium low at 2.7-likely secondary to hydrochlorothiazide.  Blood pressure medication.  # Hold hydrochlorothiazide-until further evaluation with Dr. Buren.  Continue taking metoprolol-this should not affect your potassium.  # We will plan to start potassium supplementation-however this will need to be rechecked in the next few weeks with the PCP.

## 2023-10-17 NOTE — Progress Notes (Signed)
 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

## 2023-11-17 ENCOUNTER — Inpatient Hospital Stay

## 2023-12-18 ENCOUNTER — Inpatient Hospital Stay

## 2024-01-11 ENCOUNTER — Encounter: Payer: Self-pay | Admitting: Internal Medicine

## 2024-01-18 ENCOUNTER — Inpatient Hospital Stay: Attending: Internal Medicine

## 2024-01-18 DIAGNOSIS — D510 Vitamin B12 deficiency anemia due to intrinsic factor deficiency: Secondary | ICD-10-CM | POA: Diagnosis present

## 2024-01-18 DIAGNOSIS — D539 Nutritional anemia, unspecified: Secondary | ICD-10-CM

## 2024-01-18 MED ORDER — CYANOCOBALAMIN 1000 MCG/ML IJ SOLN
1000.0000 ug | INTRAMUSCULAR | Status: AC
  Administered 2024-01-18: 1000 ug via INTRAMUSCULAR
  Filled 2024-01-18: qty 1

## 2024-04-04 IMAGING — CR DG SHOULDER 2+V*R*
1 series · 3 of 3 positions shown · non-contrast
Comparison: None Available.

CLINICAL DATA: Right shoulder pain radiating to neck

EXAM:
RIGHT SHOULDER - 2+ VIEW

[Series 1: dg shoulder right · 0.14mm/px · 3 of 3 slices shown]
[im 1/3]
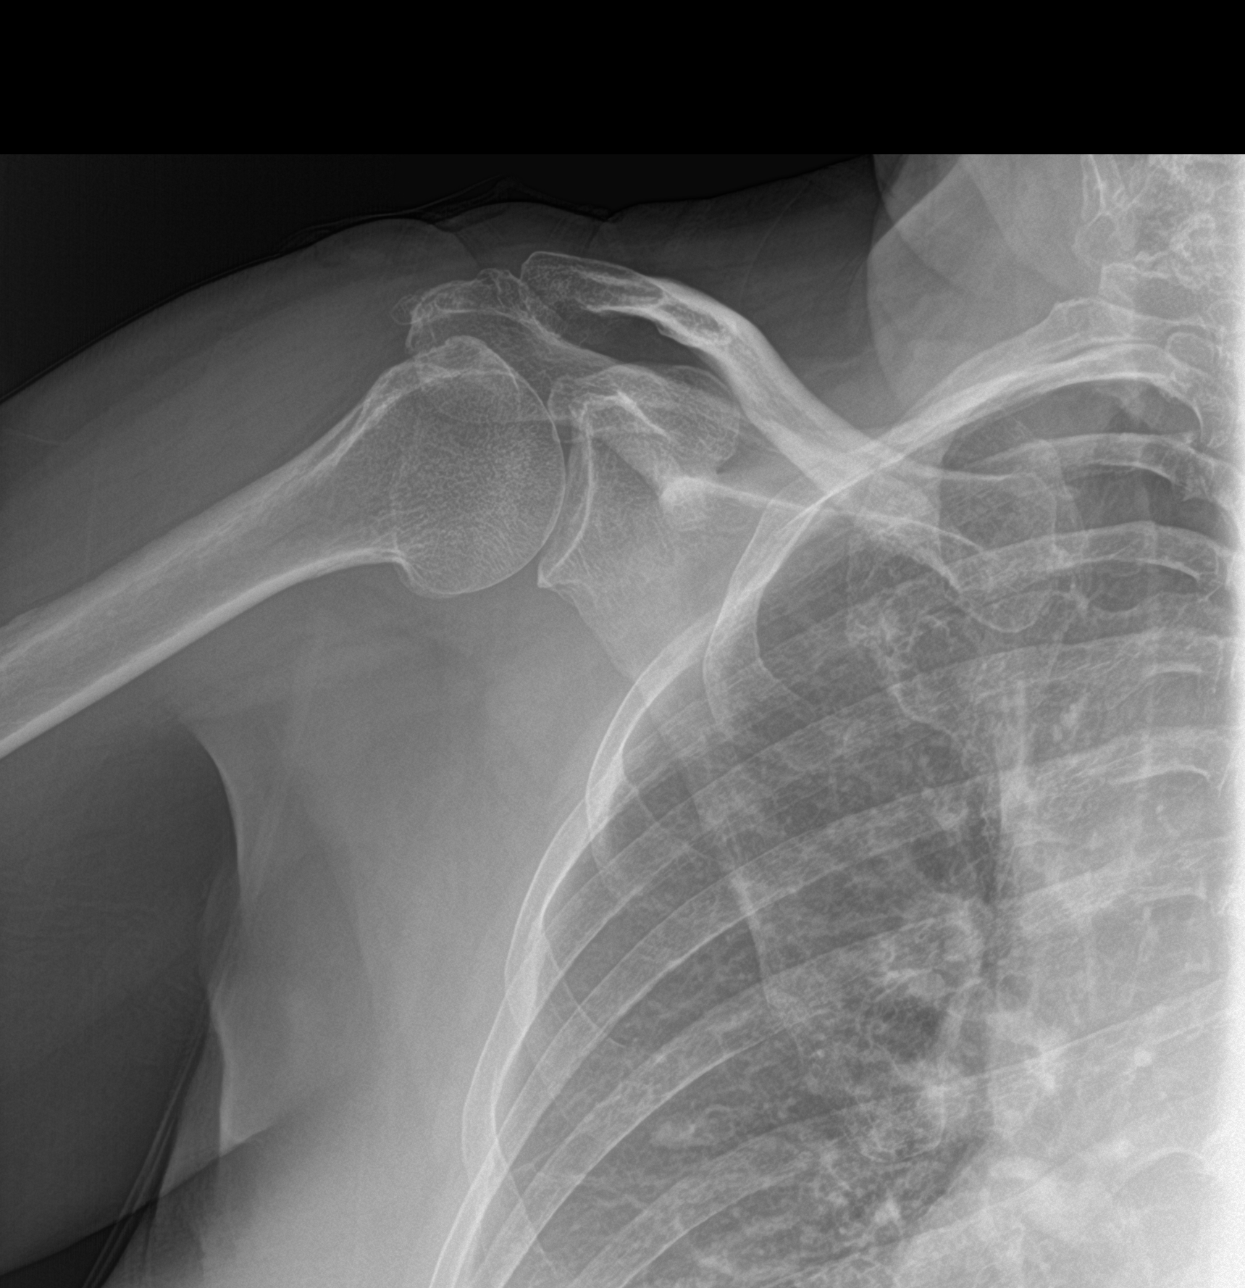
[im 2/3]
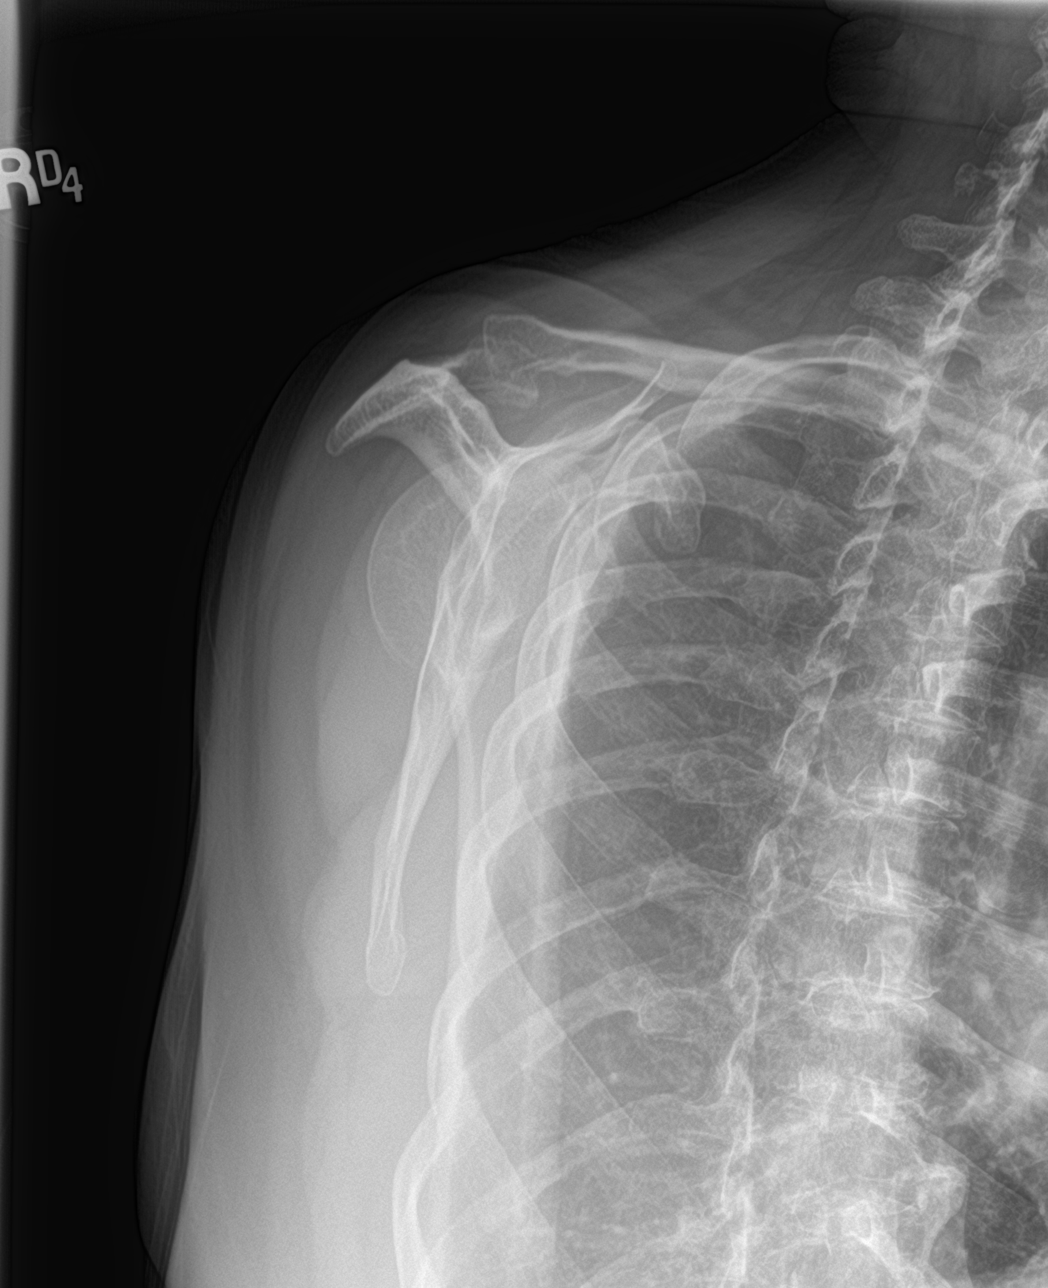
[im 3/3]
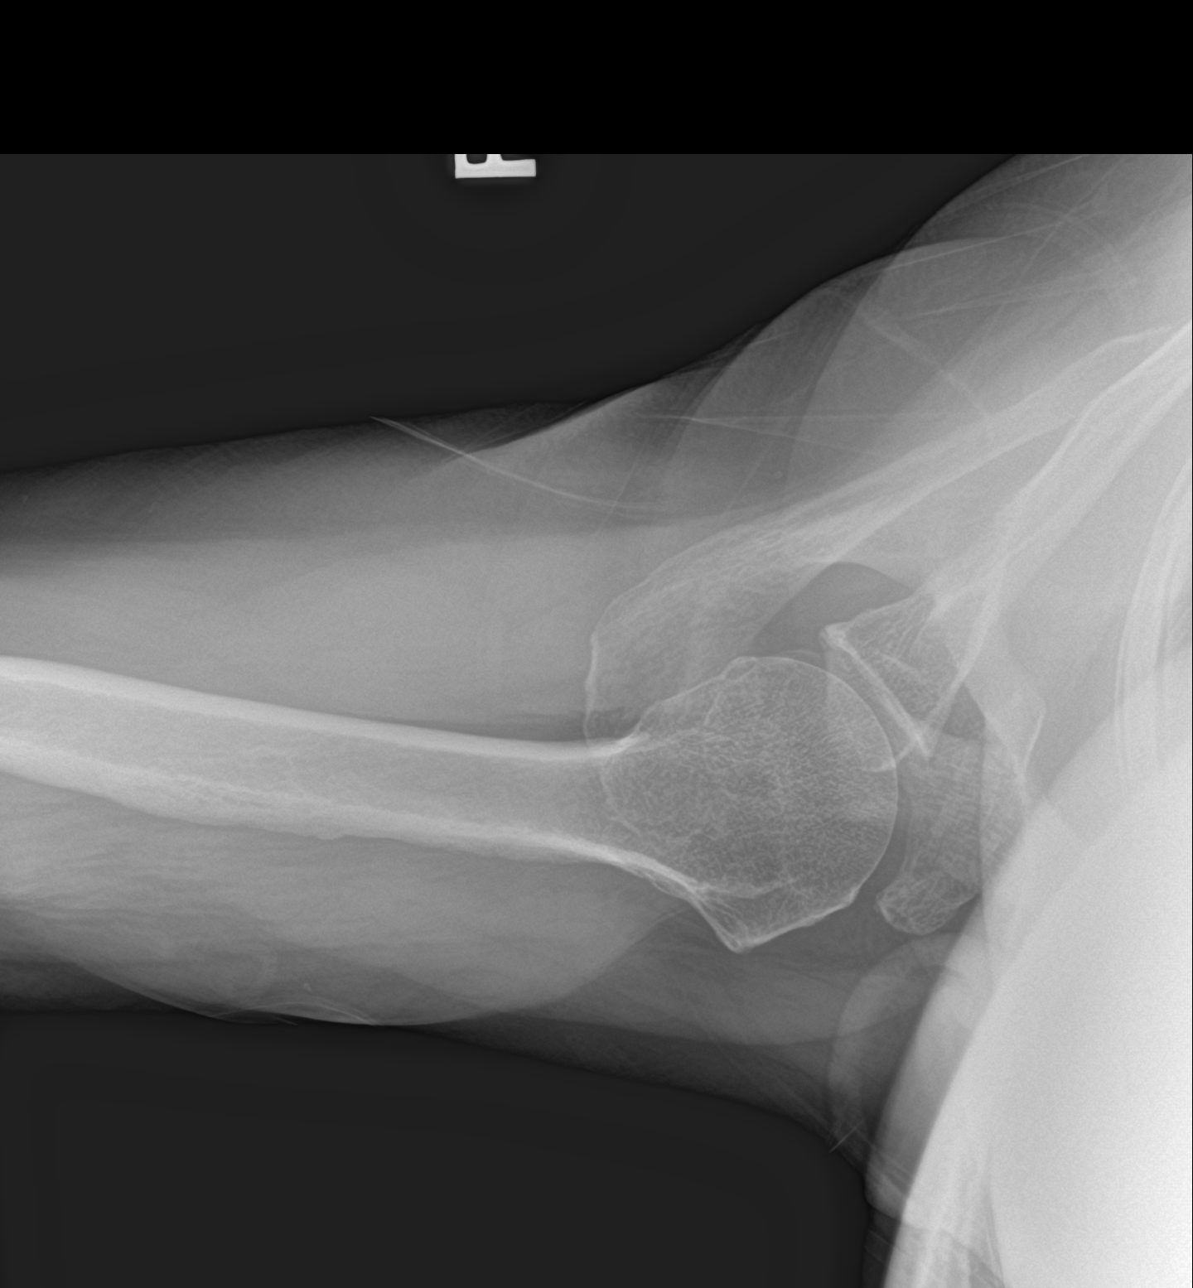

[3 of 3 positions shown; findings below may reference images not displayed]

FINDINGS: Frontal, transscapular, and axillary views of the right shoulder are
obtained. No acute fracture, subluxation, or dislocation. Moderate
hypertrophic changes are seen at the acromioclavicular joint. There
is mild glenohumeral joint osteoarthritis. Soft tissues are
unremarkable. Right chest is clear.
IMPRESSION: 1. Degenerative changes of the right shoulder as above. No acute
bony abnormality.

## 2024-04-16 ENCOUNTER — Ambulatory Visit

## 2024-04-16 ENCOUNTER — Other Ambulatory Visit

## 2024-04-16 ENCOUNTER — Ambulatory Visit: Admitting: Internal Medicine
